# Patient Record
Sex: Male | Born: 1970 | Race: White | Hispanic: No | Marital: Married | Smoking: Never smoker
Health system: Southern US, Community
[De-identification: ages and names within clinical notes are randomized; demographics above are authoritative.]

## PROBLEM LIST (undated history)

## (undated) DIAGNOSIS — K8020 Calculus of gallbladder without cholecystitis without obstruction: Secondary | ICD-10-CM

## (undated) HISTORY — DX: Calculus of gallbladder without cholecystitis without obstruction: K80.20

---

## 1996-03-18 HISTORY — PX: APPENDECTOMY: SHX54

## 1997-09-11 ENCOUNTER — Inpatient Hospital Stay (HOSPITAL_COMMUNITY): Admission: EM | Admit: 1997-09-11 | Discharge: 1997-09-14 | Payer: Self-pay | Admitting: Emergency Medicine

## 1997-09-21 ENCOUNTER — Other Ambulatory Visit: Admission: RE | Admit: 1997-09-21 | Discharge: 1997-09-21 | Payer: Self-pay | Admitting: General Surgery

## 1999-10-10 ENCOUNTER — Emergency Department (HOSPITAL_COMMUNITY): Admission: EM | Admit: 1999-10-10 | Discharge: 1999-10-10 | Payer: Self-pay | Admitting: Emergency Medicine

## 1999-10-11 ENCOUNTER — Encounter: Payer: Self-pay | Admitting: Emergency Medicine

## 2000-12-20 ENCOUNTER — Encounter: Payer: Self-pay | Admitting: Emergency Medicine

## 2000-12-20 ENCOUNTER — Emergency Department (HOSPITAL_COMMUNITY): Admission: EM | Admit: 2000-12-20 | Discharge: 2000-12-21 | Payer: Self-pay | Admitting: Emergency Medicine

## 2009-03-18 HISTORY — PX: TREATMENT FISTULA ANAL: SUR1390

## 2009-07-17 ENCOUNTER — Encounter: Admission: RE | Admit: 2009-07-17 | Discharge: 2009-07-17 | Payer: Self-pay | Admitting: Family Medicine

## 2009-07-18 ENCOUNTER — Encounter: Admission: RE | Admit: 2009-07-18 | Discharge: 2009-07-18 | Payer: Self-pay | Admitting: Family Medicine

## 2009-07-19 ENCOUNTER — Ambulatory Visit: Payer: Self-pay | Admitting: Internal Medicine

## 2009-07-19 ENCOUNTER — Emergency Department (HOSPITAL_COMMUNITY): Admission: EM | Admit: 2009-07-19 | Discharge: 2009-07-19 | Payer: Self-pay | Admitting: Emergency Medicine

## 2009-07-21 ENCOUNTER — Telehealth (INDEPENDENT_AMBULATORY_CARE_PROVIDER_SITE_OTHER): Payer: Self-pay

## 2009-07-21 ENCOUNTER — Encounter: Payer: Self-pay | Admitting: Internal Medicine

## 2009-07-21 ENCOUNTER — Encounter (INDEPENDENT_AMBULATORY_CARE_PROVIDER_SITE_OTHER): Payer: Self-pay

## 2009-07-23 ENCOUNTER — Inpatient Hospital Stay (HOSPITAL_COMMUNITY): Admission: EM | Admit: 2009-07-23 | Discharge: 2009-07-26 | Payer: Self-pay | Admitting: Emergency Medicine

## 2009-07-24 ENCOUNTER — Ambulatory Visit: Payer: Self-pay | Admitting: Gastroenterology

## 2009-07-25 ENCOUNTER — Telehealth (INDEPENDENT_AMBULATORY_CARE_PROVIDER_SITE_OTHER): Payer: Self-pay

## 2009-07-25 ENCOUNTER — Encounter: Payer: Self-pay | Admitting: Urgent Care

## 2009-07-26 ENCOUNTER — Telehealth: Payer: Self-pay | Admitting: Gastroenterology

## 2009-07-26 ENCOUNTER — Ambulatory Visit: Payer: Self-pay | Admitting: Gastroenterology

## 2009-08-23 ENCOUNTER — Encounter: Payer: Self-pay | Admitting: Internal Medicine

## 2009-08-23 DIAGNOSIS — R197 Diarrhea, unspecified: Secondary | ICD-10-CM | POA: Insufficient documentation

## 2009-11-02 ENCOUNTER — Encounter: Payer: Self-pay | Admitting: Gastroenterology

## 2009-11-23 ENCOUNTER — Encounter: Payer: Self-pay | Admitting: Internal Medicine

## 2010-04-17 NOTE — Progress Notes (Signed)
Summary: CAMPYLOBACTER INFECTION       New/Updated Medications: ZITHROMAX 250 MG TABS (AZITHROMYCIN) 2 by mouth 5/11 and 2 by mouth 5/12 Prescriptions: ZITHROMAX 250 MG TABS (AZITHROMYCIN) 2 by mouth 5/11 and 2 by mouth 5/12  #4 x 0   Entered and Authorized by:   West Bali MD   Signed by:   West Bali MD on 07/26/2009   Method used:   Electronically to        [facility] (retail)       [address]       [place], [place]  [address]       Ph: [phone] or [phone]       Fax: [phone]   RxID:   [id number]  Pt d/c today. Need Zmx 5/11-12. Rx sent. Asked to follow a lactose free diet. No antimotility agents. Use Kaopectate as needed. Call Dr. Jena Gauss if diarrhea not improved on MON. Pt also taking Augmentin 875 mg two times a day for perirectal abscess. West Bali MD  Jul 26, 2009 12:06 PM

## 2010-04-17 NOTE — Letter (Signed)
Summary: CLINIC NOTE FROM DR [facility]  CLINIC NOTE FROM DR Ezzard Standing   Imported By: Rexene Alberts 11/23/2009 14:11:25  _____________________________________________________________________  External Attachment:    Type:   Image     Comment:   External Document

## 2010-04-17 NOTE — Letter (Signed)
Summary: CONSULTATION  CONSULTATION   Imported By: Rexene Alberts 11/02/2009 09:53:00  _____________________________________________________________________  External Attachment:    Type:   Image     Comment:   External Document

## 2010-04-17 NOTE — Miscellaneous (Signed)
Summary: Orders Update  Clinical Lists Changes  Problems: Added new problem of DIARRHEA (ICD-787.91) Orders: Added new Test order of T-Culture, C-Diff Toxin A/B [phone]) - Signed Added new Test order of T-Culture, Stool ([id number]) - Signed Added new Test order of T-Fecal WBC ([id number]) - Signed  Appended Document: Orders Update per RMR- contact pt and have him do stool studies because his diarrhea started again. pts wife aware and will pick up stool containers today.

## 2010-04-17 NOTE — Letter (Signed)
Summary: CONSULTATION  CONSULTATION   Imported By: Rexene Alberts 11/02/2009 09:51:24  _____________________________________________________________________  External Attachment:    Type:   Image     Comment:   External Document

## 2010-04-17 NOTE — Progress Notes (Signed)
Summary: stool culture results  Phone Note From Other Clinic   Caller: [facility] Summary of Call: [facility] called critical report on stool culture- positive for campylobacter. please advise. pt is inpatient at [facility] Initial call taken by: Hendricks Limes LPN,  Jul 25, 2009 1:35 PM     Appended Document: stool culture results Will address inpatient.  Thx

## 2010-04-17 NOTE — Miscellaneous (Signed)
Summary: GI Previsit [facility]  Pt inpatient [facility].  Please reschedule OV for tomorrow to 1 week re:FU persistent diarrhea.  Thx  Appended Document: GI Previsit APPT [facility] pt appt to 08/07/09 at 1130 w/KJ.

## 2010-04-17 NOTE — Progress Notes (Signed)
Summary: diarrhea  Phone Note Call from Patient Call back at [phone]   Caller: Spouse Summary of Call: pts wife called- pt was seen by RMR  in the ED for diarrhea and abd pain. he was given hyomax and zofran. He took his stool samples back to the hospital yesterday (no results yet) and has an ov appt next week with [facility]. wife stated the abd pain was better but he still has the diarrhea. wants to know if there is anything he can take to help untill his appt next week. please advise. Initial call taken by: Hendricks Limes LPN,  Jul 22, 5619 9:52 AM     Appended Document: diarrhea imodium 2mg  two times a day, but not more until we know about stool studies  Appended Document: diarrhea pts wife aware

## 2010-04-17 NOTE — Miscellaneous (Signed)
Summary: [facility]  Clinical Lists Changes NAME:  George Clark, George Clark             ACCOUNT NO.:  [id number]      MEDICAL RECORD NO.:  [id number]          PATIENT TYPE:  EMS      LOCATION:  ED                            FACILITY:  [facility]      PHYSICIAN:  R. Roetta Sessions, M.D. DATE OF BIRTH:  Oct 14, 1970      DATE OF CONSULTATION:  07/19/2009   DATE OF DISCHARGE:                                    CONSULTATION      REASON FOR CONSULTATION:  A 5-day history of abdominal pain and   nonbloody diarrhea - incessant.      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  He had some transient nausea and nonbloody emesis.   His abdominal discomfort has waxed and waned over the past 5 days.  It   becomes worse spontaneously and sometimes with eating.  He saw his   primary care physician over in [facility] the first of   the week.  Ultimately workup included an ultrasound and CT which   revealed cholelithiasis but no evidence of cholecystitis.  Contrast CT   of the abdomen and pelvis confirmed cholelithiasis  but no other   significant findings.  I have reviewed both the ultrasound and CT with   Dr. Amil Amen.  There is a question of some thickening of the transverse   and descending colon, possibly representing colitis.  However, with a   diarrheal illness this may be an over-call.  No evidence of enteritis or   other acute intra-abdominal abnormality.  He continues to have   predominately intermittent waxing and waning crampy generalized   abdominal pain along with nonbloody diarrhea.  He has an appointment to   see his gastroenterologist, Dr. Ritta Slot, sometime next week but   elected to come to the emergency department  today when he was seen by   Dr. Lynelle Doctor.  Workup in the emergency department included a Chem-20, CBC,   urinalysis and lipase all of which came back normal.  He has been given   a dose of Zofran 4 mg here in the emergency department and has not had   any nausea or vomiting.  In fact after telephone consultation he was   given a trial of liquids in the way of ginger ale and has held that down   and done very well and has not had any abdominal pain while being here   over a nearly 3 hour period.      George Clark premorbidly, relates one formed bowel movement daily.  He   has not had any rectal bleeding or melena recently.  No sick contacts.   No unusual travel.  No antibiotics exposure recently.  No family history   of GI illness.  He did undergo a colonoscopy about a year ago by Dr. Kinnie Scales for rectal   bleeding without significant findings.      PAST MEDICAL HISTORY:  Significant for no chronic illnesses.  Status   post appendectomy years ago.  Denies chronic illnesses.  Takes no   prescribed medications although was given a prescription for Vicodin   early this week by his primary care physician.      ALLERGIES:  NO KNOWN DRUG ALLERGIES.      FAMILY HISTORY:  Negative for chronic GI or liver illness.      SOCIAL HISTORY:  The patient is a Visual merchandiser.  He has three daughters in   good health.  He does not use tobacco or alcohol products.      REVIEW OF SYSTEMS:  Has lost 7 pounds in the past 5 days with the acute   illness.  Does not have any chronic symptoms such as odynophagia,   dysphagia, early satiety, reflux symptoms.  Denies any history of yellow   jaundice.  No fever, chills.  Otherwise as in history of present   illness.      PHYSICAL EXAMINATION:  A very pleasant, comfortable-appearing gentleman   seen in ED room 11, accompanied by his daughter and father-in-law, BP   96/61, pulse 64, respiratory rate 20, temperature 93 orally.   SKIN: Warm and dry.   HEENT EXAM: No  scleral icterus.  Conjunctivae pink.  Oral cavity: No   lesions.   CHEST: Lungs clear to auscultation.   CARDIAC EXAM: Regular rate and rhythm without murmur, gallop or rub.   ABDOMEN: Flat, positive bowel sounds, soft, entirely nontender without   appreciable mass or organomegaly.   EXTREMITY EXAM: No edema.      LABORATORY DATA:  In the emergency department, white count 4.3,   hemoglobin 14.2 hematocrit 39.7, and MCV 90.2, platelet count 214,000.   Sodium 136, potassium 4.9, chloride 100, carbon dioxide 31, glucose 90,   BUN 3, creatinine 1.02, calcium 9.2, total protein 7.9, albumin 4.0, AST   16, ALT 14.  Urinalysis negative.  Lipase normal at 37.  CBC   differential monocyte count slightly elevated at 21.      IMPRESSION:  George Clark is a very pleasant 40 year old   gentleman with a 5-day history of nonbloody diarrhea, waxing and waning   crampy generalized abdominal pain along with transient nausea and   vomiting.  Symptoms of diarrhea and abdominal pain have now persisted 5   days.  At this time, George Clark does not appear acutely ill or toxic.   Findings of ultrasound and CT scanning along with labs as outlined   above.      It is my impression that George Clark most likely has a viral induced   gastroenteritis (primarily an enteritis or enterocolitis at this time).   He certainly could have food-borne illness as well.  There is nothing to   suggest the onset of inflammatory bowel disease.  Did not see any   surgical process at this time.      He does have cholelithiasis likely a bystander finding in this setting.      RECOMMENDATIONS:   1. The patient has received IV fluids here and is taking clear liquids       and really looks quite stable at this time.  I recommend he be       allowed to go home on clear liquids rapidly advanced to a [address]  diet.   2. Prescription for HyoMax 0.125 mg tablets, #30, one sublingually       a.c. and h.s. p.r.n. abdominal  pain.   3. Zofran 4 mg tablets, dispense #20, one p.o. q.8 hours p.r.n.       nausea.   4. Will arrange for him to go home with a stool collection container       to return a fresh stool specimen for lactoferrin, C diff, and       culture.   5. Will arrange for a follow-up appointment with [place] in the office next       week to assess his progress.  If his clinical condition       deteriorates in the interim, he is to call and let me know.      I would thank Dr. Lynelle Doctor here in the ED for allowing me to see this very   nice gentleman today.               Jonathon Bellows, M.D.            RMR/MEDQ  D:  07/19/2009  T:  07/19/2009  Job:  [id number]      cc:   Dr. Essie Hart [employer]      Griffith Citron, M.D.   Fax: [phone]    CT Abd/Pelvis W CM - STATUS: Final  IMAGE                                     Perform Date: 3 May11 11:46  Ordered By: Elyse Jarvis,         Ordered Date: 3 May11 09:53  Facility: [facility]                              Department: CT  Service Report Text  GDC Accession Number: [id number]      Clinical Data: Abdominal pain.  Anorexia.  Nausea.  Cholelithiasis.    CT ABDOMEN AND PELVIS WITH CONTRAST    Technique:  Multidetector CT imaging of the abdomen and pelvis was   performed following the standard protocol during bolus   administration of intravenous contrast.    Contrast: 100 ml Omnipaque-300 and oral contrast    Comparison: None.    Findings: Cholelithiasis is noted, however there is no evidence of   gallbladder wall thickening or pericholecystic inflammatory change.   No evidence of biliary ductal dilatation.    The pancreas is normal in appearance.  There is no evidence of   pancreatic mass or peripancreatic inflammatory changes.  No   abnormal fluid collections are seen within the abdomen or pelvis.    The liver, spleen, adrenal glands, and kidneys are normal   appearance.  No soft tissue masses or adenopathy  identified.  No   evidence of dilated bowel loops or hernia.  IMPRESSION:    1.  No acute findings in the abdomen or pelvis.   2.  Cholelithiasis.  No CT evidence of pancreatitis or pancreatic   mass.    Read By:  Danae Orleans,  M.D.   Released By:  Danae Orleans,  M.D.  Additional Information  HL7 RESULT STATUS : F  External image : [id number]  External IF Update Timestamp : 2009-07-18:11:56:25.000000    US Abdomen Complete -  STATUS: Final  IMAGE                                     Perform Date: 2 May11 16:29  Ordered By: Elyse Jarvis,         Ordered Date: 2 May11 15:34  Facility: [facility]                              Department: [place]  Service Report Text  GDC Accession Number: [id number]      Clinical Data:  Abdominal pain with nausea and vomiting    COMPLETE ABDOMINAL ULTRASOUND    Comparison:  None.    Findings:    Gallbladder:  The gallbladder is small and may contain stones.   However, the patient ate a small meal around 11:30 a.m. and   therefore is not fasting for this exam.  The gallbladder findings   could therefore artifactual, due to physiological contractions.  If   further assessment as clinically warranted, a repeat limited study   should be done following an overnight fast.    Common bile duct:  2.7 mm    Liver:  No focal lesion identified.  Within normal limits in   parenchymal echogenicity.    IVC:  Normal    Pancreas:  Body normal.  Head and tail suboptimally visualized due   to overlying bowel gas.    Spleen:  Upper normal in size with no lesions.    Right Kidney:  10.4 cm in length.  No hydronephrosis or other   pathology.    Left Kidney:  10.6 cm in length.  No pathology.    Abdominal aorta:  Normal.    IMPRESSION:    1.  The gallbladder is contracted but this may be physiological,   since the patient ate a small meal several hours before the exam.   See report.   2.  Pancreas suboptimally visualized.   3.  No other  acute or significant findings.    Read By:  Bernerd Limbo,  M.D.   Released By:  Bernerd Limbo,  M.D.  Additional Information  HL7 RESULT STATUS : F  External image : [id number]  External IF Update Timestamp : 2009-07-17:16:37:48.000000   L-Lipase, Blood - STATUS: Final                                            Perform Date: 4 May11 12:56  Ordered By: Aggie Hacker,             Ordered Date: 4 May11 12:49                                       Last Updated Date: 4 May11 14:51  Facility: [facility]                               Department: GENL  Accession #: [id number]                  USN:       [id number]  Findings  Result  Name                              Result     Abnl   Normal Range     Units      Perf. Loc.  Lipase                                            37                11-59            U/L  Additional Information  HL7 RESULT STATUS : F  External IF Update Timestamp : 2009-07-19:14:48:00.000000    L-CBC-with Differential - STATUS: Final                                            Perform Date: 4 May11 12:54  Ordered By: Aggie Hacker,             Ordered Date: 4 May11 12:35                                       Last Updated Date: 4 May11 13:52  Facility: [facility]                               Department: GENL  Accession #: [id number]                    USN:       [id number]  Findings  Result Name                              Result     Abnl   Normal Range     Units      Perf. Loc.  WBC                                           4.3               4.0-10.5         K/uL  RBC                                            4.40              4.22-5.81        MIL/uL  Hemoglobin (HGB)                        14.2              13.0-17.0        g/dL  Hematocrit (HCT)  39.7              39.0-52.0        %  MCV                                            90.2              78.0-100.0       fL  MCHC                                           35.8              30.0-36.0        g/dL  RDW                                            11.9              11.5-15.5        %  Platelet Count (PLT)                       214               150-400          K/uL  Neutrophils, %                               51                43-77            %  Lymphocytes, %                           25                12-46            %  Monocytes, %                               21         h      3-12             %  Eosinophils, %                                3                 0-5              %  Basophils, %                                   0                 0-1              %  Neutrophils, Absolute  2.2               1.7-7.7          K/uL  Lymphocytes, Absolute                  1.1               0.7-4.0          K/uL  Monocytes, Absolute                      0.9               0.1-1.0          K/uL  Eosinophils, Absolute                      0.1               0.0-0.7          K/uL  Basophils, Absolute                        0.0               0.0-0.1          K/uL  WBC Morphology                           SEE NOTE.    TOXIC GRANULATION  Additional Information  HL7 RESULT STATUS : F  External IF Update Timestamp : 2009-07-19:13:48:00.000000    L-CMP (Comprehensive Metabolic Pnl-CMET) - STATUS: Final                                            Perform Date: 4 May11 12:54  Ordered By: Aggie Hacker,             Ordered Date: 4 May11 12:35                                       Last Updated Date: 4 May11 13:43  Facility: [facility]                               Department: GENL  Accession #: [id number]                     USN:       [id number]  Findings  Result Name                              Result     Abnl   Normal Range     Units      Perf. Loc.  Sodium (NA)                              136               135-145          mEq/L  Potassium (K)  4.9               3.5-5.1           mEq/L  Chloride                                     100               96-112           mEq/L  CO2                                            31                19-32            mEq/L  Glucose                                      90                70-99            mg/dL  BUN                                           13                6-23             mg/dL  Creatinine                                  1.02               0.4-1.5         mg/dL           GFR, Est African American           >60               >60              mL/min    Oversized comment, see footnote    Bilirubin, Total                               0.5               0.3-1.2          mg/dL  Alkaline Phosphatase                     62                39-117           U/L  SGOT (AST)                                  16                0-37  U/L  SGPT (ALT)                                   14                0-53             U/L  Total  Protein                                 7.9               6.0-8.3          g/dL  Albumin-Blood                               4.0               3.5-5.2          g/dL  Calcium                                         9.2               8.4-10.5         mg/dL  Footnotes  1. The eGFR has been calculated     using the MDRD equation.     This calculation has not been     validated in all clinical     situations.     eGFR's persistently     <60 mL/min signify     possible Chronic Kidney Disease.  Additional Information  HL7 RESULT STATUS : F  External IF Update Timestamp : 2009-07-19:13:39:00.000000

## 2010-06-05 LAB — CBC
MCHC: 36 g/dL (ref 30.0–36.0)
MCV: 89.9 fL (ref 78.0–100.0)
Platelets: 214 10*3/uL (ref 150–400)
RBC: 3.85 MIL/uL — ABNORMAL LOW (ref 4.22–5.81)
RDW: 11.9 % (ref 11.5–15.5)

## 2010-06-05 LAB — DIFFERENTIAL
Basophils Relative: 0 % (ref 0–1)
Basophils Relative: 1 % (ref 0–1)
Eosinophils Absolute: 0.1 10*3/uL (ref 0.0–0.7)
Eosinophils Absolute: 0.1 10*3/uL (ref 0.0–0.7)
Eosinophils Relative: 2 % (ref 0–5)
Lymphocytes Relative: 25 % (ref 12–46)
Monocytes Absolute: 0.9 10*3/uL (ref 0.1–1.0)
Monocytes Absolute: 1 10*3/uL (ref 0.1–1.0)
Monocytes Relative: 13 % — ABNORMAL HIGH (ref 3–12)
Neutro Abs: 2.2 10*3/uL (ref 1.7–7.7)
Neutrophils Relative %: 67 % (ref 43–77)

## 2010-06-05 LAB — URINALYSIS, ROUTINE W REFLEX MICROSCOPIC
Bilirubin Urine: NEGATIVE
Glucose, UA: NEGATIVE mg/dL
Hgb urine dipstick: NEGATIVE
Ketones, ur: NEGATIVE mg/dL
Nitrite: NEGATIVE
Protein, ur: NEGATIVE mg/dL
Specific Gravity, Urine: 1.015 (ref 1.005–1.030)
Urobilinogen, UA: 0.2 mg/dL (ref 0.0–1.0)
pH: 6 (ref 5.0–8.0)

## 2010-06-05 LAB — BASIC METABOLIC PANEL
CO2: 32 mEq/L (ref 19–32)
Chloride: 101 mEq/L (ref 96–112)
Creatinine, Ser: 1.05 mg/dL (ref 0.4–1.5)
GFR calc Af Amer: >60 mL/min (ref >60)

## 2010-06-05 LAB — CULTURE, ROUTINE-ABSCESS

## 2010-06-05 LAB — COMPREHENSIVE METABOLIC PANEL
ALT: 14 U/L (ref 0–53)
AST: 16 U/L (ref 0–37)
Albumin: 4 g/dL (ref 3.5–5.2)
Alkaline Phosphatase: 62 U/L (ref 39–117)
Potassium: 4.9 mEq/L (ref 3.5–5.1)
Sodium: 136 mEq/L (ref 135–145)
Total Protein: 7.9 g/dL (ref 6.0–8.3)

## 2012-12-17 ENCOUNTER — Ambulatory Visit (INDEPENDENT_AMBULATORY_CARE_PROVIDER_SITE_OTHER): Payer: BC Managed Care – PPO | Admitting: Otolaryngology

## 2012-12-17 DIAGNOSIS — H60339 Swimmer's ear, unspecified ear: Secondary | ICD-10-CM

## 2012-12-31 ENCOUNTER — Ambulatory Visit (INDEPENDENT_AMBULATORY_CARE_PROVIDER_SITE_OTHER): Payer: BC Managed Care – PPO | Admitting: Otolaryngology

## 2013-11-19 ENCOUNTER — Other Ambulatory Visit (HOSPITAL_COMMUNITY): Payer: Self-pay | Admitting: Internal Medicine

## 2013-11-19 DIAGNOSIS — R10811 Right upper quadrant abdominal tenderness: Secondary | ICD-10-CM

## 2013-11-19 DIAGNOSIS — R109 Unspecified abdominal pain: Secondary | ICD-10-CM

## 2013-11-23 ENCOUNTER — Other Ambulatory Visit (HOSPITAL_COMMUNITY): Payer: Self-pay | Admitting: Internal Medicine

## 2013-11-23 DIAGNOSIS — R109 Unspecified abdominal pain: Secondary | ICD-10-CM

## 2013-11-26 ENCOUNTER — Other Ambulatory Visit (HOSPITAL_COMMUNITY): Payer: BC Managed Care – PPO

## 2013-11-29 ENCOUNTER — Ambulatory Visit (HOSPITAL_COMMUNITY)
Admission: RE | Admit: 2013-11-29 | Discharge: 2013-11-29 | Disposition: A | Discharge status: Home / Self Care | Payer: BC Managed Care – PPO | Source: Ambulatory Visit | Attending: Internal Medicine | Admitting: Internal Medicine

## 2013-11-29 DIAGNOSIS — R10811 Right upper quadrant abdominal tenderness: Secondary | ICD-10-CM | POA: Diagnosis present

## 2013-11-29 DIAGNOSIS — R932 Abnormal findings on diagnostic imaging of liver and biliary tract: Secondary | ICD-10-CM | POA: Insufficient documentation

## 2013-11-29 DIAGNOSIS — R109 Unspecified abdominal pain: Secondary | ICD-10-CM

## 2013-11-29 MED ORDER — IOHEXOL 350 MG/ML SOLN
100.0000 mL | Freq: Once | INTRAVENOUS | Status: AC | PRN
  Administered 2013-11-29: 100 mL via INTRAVENOUS

## 2014-11-01 ENCOUNTER — Ambulatory Visit (INDEPENDENT_AMBULATORY_CARE_PROVIDER_SITE_OTHER): Payer: BLUE CROSS/BLUE SHIELD | Admitting: Nurse Practitioner

## 2014-11-01 ENCOUNTER — Other Ambulatory Visit: Payer: Self-pay

## 2014-11-01 ENCOUNTER — Encounter: Payer: Self-pay | Admitting: Nurse Practitioner

## 2014-11-01 VITALS — BP 98/62 | HR 67 | Temp 97.5°F | Ht 69.0 in | Wt 132.8 lb

## 2014-11-01 DIAGNOSIS — R1033 Periumbilical pain: Secondary | ICD-10-CM

## 2014-11-01 DIAGNOSIS — R109 Unspecified abdominal pain: Secondary | ICD-10-CM | POA: Insufficient documentation

## 2014-11-01 MED ORDER — OMEPRAZOLE 20 MG PO CPDR
20.0000 mg | DELAYED_RELEASE_CAPSULE | Freq: Every day | ORAL | Status: DC
Start: 2014-11-01 — End: 2021-12-30

## 2014-11-01 NOTE — Progress Notes (Signed)
Primary Care Physician:  Cassell Smiles., MD Primary Gastroenterologist:  Dr. Jena Gauss  Chief Complaint  Patient presents with  . Abdominal Pain    HPI:   44 year old male presents for abdominal pain. Has historically been seen by our office with the last visit in 2011. Today he states he has been having periumbilical abdominal pain, at times severe. Pain is also in his lower to mid back and is constant. Abdominal pain was intermittent but has progressed to constant. Has had these symptoms for about 7 months. Patient has also been having bouts of diarrhea for the last couple weeks. Pain not typically associated with eating. Has also had decreased appetite. Wife states she noticed more complaints of pain after eating, especially greasy or fatty foods. Pain currently 5-6/10. Denies N/V, hematochezia, melena. Has noted some excess mucus in some of his stools. Denies fever/chills of unknown origin, unintentional weight loss. Has noted some occasional chest pain, denies dyspnea, dizziness, lightheadedness, syncope, near syncope. Has had occasional esophageal burning and bloating/feeling of fullness. Denies bitter taste, excessive belching. Denies any other upper or lower GI symptoms. Has been taking Advil for pain for there past month about 200 mg 1-2 times a day.   RUQ [place] last year showed sludge vs non-shadowing stones and borderline CBD diameter. Follow-up CT abdomen/pelvis showed cholelithiasis.  Past Medical History  Diagnosis Date  . Gallstones     Dx last year    Past Surgical History  Procedure Laterality Date  . Appendectomy  1998  . Treatment fistula anal  2011    Current Outpatient Prescriptions  Medication Sig Dispense Refill  . ibuprofen (ADVIL,MOTRIN) 100 MG tablet Take 200 mg by mouth every 6 (six) hours as needed for fever (as needed).    Marland Kitchen omeprazole (PRILOSEC) 20 MG capsule Take 1 capsule (20 mg total) by mouth daily. 90 capsule 3   No current facility-administered  medications for this visit.    Allergies as of 11/01/2014  . (No Known Allergies)    Family History  Problem Relation Age of Onset  . Colon cancer Neg Hx   . Pancreatic cancer Father 85    Social History   Social History  . Marital Status: Married    Spouse Name: N/A  . Number of Children: N/A  . Years of Education: N/A   Occupational History  . Not on file.   Social History Main Topics  . Smoking status: Never Smoker   . Smokeless tobacco: Former Neurosurgeon    Types: Chew    Quit date: 10/31/2008  . Alcohol Use: No  . Drug Use: No  . Sexual Activity: Not on file   Other Topics Concern  . Not on file   Social History Narrative  . No narrative on file    Review of Systems: General: Negative for anorexia, weight loss, fever, chills, fatigue, weakness. Eyes: Negative for vision changes.  ENT: Negative for hoarseness, difficulty swallowing. CV: Negative for chest pain, angina, palpitations, peripheral edema.  Respiratory: Negative for dyspnea at rest, cough, sputum, wheezing.  GI: See history of present illness. Derm: Negative for rash or itching.  Endo: Negative for unusual weight change.  Heme: Negative for bruising or bleeding. Allergy: Negative for rash or hives.    Physical Exam: BP 98/62 mmHg  Pulse 67  Temp(Src) 97.5 F (36.4 C)  Ht 5\' 9"  (1.753 m)  Wt 132 lb 12.8 oz (60.238 kg)  BMI 19.60 kg/m2 General:   Alert and oriented. Pleasant and cooperative.  Well-nourished and well-developed.  Head:  Normocephalic and atraumatic. Eyes:  Without icterus, sclera clear and conjunctiva pink.  Ears:  Normal auditory acuity. Cardiovascular:  S1, S2 present without murmurs appreciated. Normal pulses noted. Extremities without clubbing or edema. Respiratory:  Clear to auscultation bilaterally. No wheezes, rales, or rhonchi. No distress.  Gastrointestinal:  +BS, soft, and non-distended. Mild epigastric and RUQ pain to palpation. No HSM noted. No guarding or rebound.  No masses appreciated.  Rectal:  Deferred  Musculoskalatal:  Symmetrical without gross deformities. Normal posture. Skin:  Intact without significant lesions or rashes. Neurologic:  Alert and oriented x4;  grossly normal neurologically. Psych:  Alert and cooperative. Normal mood and affect. Heme/Lymph/Immune: No excessive bruising noted.    11/02/2014 3:46 PM

## 2014-11-01 NOTE — Patient Instructions (Signed)
1. Have your labs drawn when you're able. 2. We will help you schedule a HIDA scan. 3. Start taking Prilosec 20 mg once a day, 30 minutes before your first meal the day. 4. Return for follow-up in 4 weeks.

## 2014-11-02 LAB — CBC WITH DIFFERENTIAL/PLATELET
BASOS PCT: 1 % (ref 0–1)
Basophils Absolute: 0.1 10*3/uL (ref 0.0–0.1)
EOS ABS: 0.1 10*3/uL (ref 0.0–0.7)
EOS PCT: 1 % (ref 0–5)
HCT: 37.2 % — ABNORMAL LOW (ref 39.0–52.0)
Hemoglobin: 13 g/dL (ref 13.0–17.0)
Lymphocytes Relative: 27 % (ref 12–46)
Lymphs Abs: 1.4 10*3/uL (ref 0.7–4.0)
MCH: 30.9 pg (ref 26.0–34.0)
MCHC: 34.9 g/dL (ref 30.0–36.0)
MCV: 88.4 fL (ref 78.0–100.0)
MONO ABS: 0.5 10*3/uL (ref 0.1–1.0)
MONOS PCT: 9 % (ref 3–12)
MPV: 10.2 fL (ref 8.6–12.4)
NEUTROS PCT: 62 % (ref 43–77)
Neutro Abs: 3.2 10*3/uL (ref 1.7–7.7)
PLATELETS: 280 10*3/uL (ref 150–400)
RBC: 4.21 MIL/uL — ABNORMAL LOW (ref 4.22–5.81)
RDW: 12.4 % (ref 11.5–15.5)
WBC: 5.1 10*3/uL (ref 4.0–10.5)

## 2014-11-02 LAB — LIPASE: Lipase: 26 U/L (ref 7–60)

## 2014-11-02 LAB — COMPREHENSIVE METABOLIC PANEL
ALT: 9 U/L (ref 9–46)
AST: 12 U/L (ref 10–40)
Albumin: 4.2 g/dL (ref 3.6–5.1)
Alkaline Phosphatase: 64 U/L (ref 40–115)
BUN: 18 mg/dL (ref 7–25)
CHLORIDE: 102 mmol/L (ref 98–110)
CO2: 29 mmol/L (ref 20–31)
CREATININE: 1.08 mg/dL (ref 0.60–1.35)
Calcium: 8.9 mg/dL (ref 8.6–10.3)
Glucose, Bld: 88 mg/dL (ref 65–99)
POTASSIUM: 4.1 mmol/L (ref 3.5–5.3)
SODIUM: 138 mmol/L (ref 135–146)
Total Bilirubin: 0.5 mg/dL (ref 0.2–1.2)
Total Protein: 7.1 g/dL (ref 6.1–8.1)

## 2014-11-02 NOTE — Assessment & Plan Note (Signed)
Patient with abdominal pain primarily periumbilical as well as right upper quadrant. Symptoms appear to be worse after eating especially greasy foods. Also noted some esophageal burning as well as bloating/feeling of fullness. No red flag/warning signs or symptoms. Possible etiologies include gastritis, GERD, esophagitis. However the patient is also demonstrated gallstones last year on her abdominal ultrasound. Also possible pancreatitis. Today we will order a CBC, CMP, and lipase. We'll order a HIDA scan with ensure to evaluate gallbladder function. We'll also start him on Prilosec 20 mg daily to assess for possible acid related symptoms. We'll have him return in 4 weeks for follow-up and further evaluation. At that point depending on results can consider referral to surgery versus endoscopy or possibly CT of the abdomen.

## 2014-11-02 NOTE — Progress Notes (Signed)
CC'ED TO PCP 

## 2014-11-04 ENCOUNTER — Encounter (HOSPITAL_COMMUNITY): Admission: RE | Admit: 2014-11-04 | Payer: BLUE CROSS/BLUE SHIELD | Source: Ambulatory Visit

## 2014-11-11 ENCOUNTER — Encounter (HOSPITAL_COMMUNITY): Payer: Self-pay

## 2014-11-11 ENCOUNTER — Encounter (HOSPITAL_COMMUNITY)
Admission: RE | Admit: 2014-11-11 | Discharge: 2014-11-11 | Disposition: A | Discharge status: Home / Self Care | Payer: BLUE CROSS/BLUE SHIELD | Source: Ambulatory Visit | Attending: Nurse Practitioner | Admitting: Nurse Practitioner

## 2014-11-11 DIAGNOSIS — R1033 Periumbilical pain: Secondary | ICD-10-CM | POA: Insufficient documentation

## 2014-11-11 MED ORDER — TECHNETIUM TC 99M MEBROFENIN IV KIT
5.0000 | PACK | Freq: Once | INTRAVENOUS | Status: DC | PRN
  Administered 2014-11-11: 5 via INTRAVENOUS
  Filled 2014-11-11: qty 6

## 2014-11-14 ENCOUNTER — Telehealth: Payer: Self-pay | Admitting: Internal Medicine

## 2014-11-14 NOTE — Telephone Encounter (Signed)
Routing to [facility] for results.

## 2014-11-14 NOTE — Telephone Encounter (Signed)
PATIENT CALLED INQUIRING ABOUT RESULTS FROM RADIOLOGY PROCEDURE.

## 2014-11-17 NOTE — Telephone Encounter (Signed)
Please see result note 

## 2014-11-17 NOTE — Telephone Encounter (Signed)
I spoke with the pt.  

## 2014-12-05 ENCOUNTER — Telehealth: Payer: Self-pay | Admitting: Nurse Practitioner

## 2014-12-05 ENCOUNTER — Encounter: Payer: Self-pay | Admitting: Nurse Practitioner

## 2014-12-05 ENCOUNTER — Ambulatory Visit: Payer: BLUE CROSS/BLUE SHIELD | Admitting: Nurse Practitioner

## 2014-12-05 NOTE — Telephone Encounter (Signed)
PATIENT WAS A NO SHOW AND LETTER SENT  °

## 2014-12-07 NOTE — Telephone Encounter (Signed)
Noted  

## 2015-12-28 ENCOUNTER — Emergency Department (HOSPITAL_COMMUNITY): Payer: BLUE CROSS/BLUE SHIELD

## 2015-12-28 ENCOUNTER — Encounter (HOSPITAL_COMMUNITY): Payer: Self-pay | Admitting: Emergency Medicine

## 2015-12-28 ENCOUNTER — Emergency Department (HOSPITAL_COMMUNITY)
Admission: EM | Admit: 2015-12-28 | Discharge: 2015-12-28 | Disposition: A | Discharge status: Home / Self Care | Payer: BLUE CROSS/BLUE SHIELD | Attending: Emergency Medicine | Admitting: Emergency Medicine

## 2015-12-28 DIAGNOSIS — R079 Chest pain, unspecified: Secondary | ICD-10-CM | POA: Diagnosis not present

## 2015-12-28 DIAGNOSIS — R0789 Other chest pain: Secondary | ICD-10-CM | POA: Insufficient documentation

## 2015-12-28 DIAGNOSIS — R072 Precordial pain: Secondary | ICD-10-CM | POA: Diagnosis not present

## 2015-12-28 DIAGNOSIS — R202 Paresthesia of skin: Secondary | ICD-10-CM

## 2015-12-28 DIAGNOSIS — R200 Anesthesia of skin: Secondary | ICD-10-CM | POA: Diagnosis not present

## 2015-12-28 LAB — CBC
HEMATOCRIT: 42.3 % (ref 39.0–52.0)
HEMOGLOBIN: 14.7 g/dL (ref 13.0–17.0)
MCH: 31.1 pg (ref 26.0–34.0)
MCHC: 34.8 g/dL (ref 30.0–36.0)
MCV: 89.4 fL (ref 78.0–100.0)
Platelets: 273 10*3/uL (ref 150–400)
RBC: 4.73 MIL/uL (ref 4.22–5.81)
RDW: 11.7 % (ref 11.5–15.5)
WBC: 5.1 10*3/uL (ref 4.0–10.5)

## 2015-12-28 LAB — I-STAT TROPONIN, ED
TROPONIN I, POC: 0.01 ng/mL (ref 0.00–0.08)
TROPONIN I, POC: 0 ng/mL (ref 0.00–0.08)

## 2015-12-28 LAB — BASIC METABOLIC PANEL
ANION GAP: 9 (ref 5–15)
BUN: 18 mg/dL (ref 6–20)
CHLORIDE: 102 mmol/L (ref 101–111)
CO2: 26 mmol/L (ref 22–32)
Calcium: 9.1 mg/dL (ref 8.9–10.3)
Creatinine, Ser: 1.15 mg/dL (ref 0.61–1.24)
GFR calc non Af Amer: >60 mL/min (ref >60)
GLUCOSE: 112 mg/dL — AB (ref 65–99)
POTASSIUM: 3.3 mmol/L — AB (ref 3.5–5.1)
Sodium: 137 mmol/L (ref 135–145)

## 2015-12-28 MED ORDER — NITROGLYCERIN 0.4 MG SL SUBL
0.4000 mg | SUBLINGUAL_TABLET | SUBLINGUAL | Status: DC | PRN
  Administered 2015-12-28: 0.4 mg via SUBLINGUAL
  Filled 2015-12-28: qty 1

## 2015-12-28 MED ORDER — GI COCKTAIL ~~LOC~~
30.0000 mL | Freq: Once | ORAL | Status: AC
  Administered 2015-12-28: 30 mL via ORAL
  Filled 2015-12-28: qty 30

## 2015-12-28 MED ORDER — GI COCKTAIL ~~LOC~~: 30.0000 mL | Freq: Once | ORAL | Status: DC

## 2015-12-28 MED ORDER — ASPIRIN EC 325 MG PO TBEC
325.0000 mg | DELAYED_RELEASE_TABLET | Freq: Once | ORAL | Status: AC
  Administered 2015-12-28: 325 mg via ORAL
  Filled 2015-12-28: qty 1

## 2015-12-28 NOTE — ED Notes (Signed)
Taken to CT.

## 2015-12-28 NOTE — ED Triage Notes (Signed)
Pt arrives via POV from home with substernal CP that began 2 days ago. Pt reports pain has gradually worsened. Pt reports dry nonproductive cough, denies fever. States pain is heavy. Denies heart hx. Pt also reports diaphoresis. Left arm tingling.

## 2015-12-28 NOTE — ED Notes (Signed)
Patient at MRI 

## 2015-12-28 NOTE — ED Notes (Signed)
Pt is in stable condition upon d/c and ambulates from ED. 

## 2015-12-28 NOTE — ED Provider Notes (Signed)
[facility]-EMERGENCY DEPT Provider Note   CSN: [id number] Arrival date & time: 12/28/15  1478     History   Chief Complaint Chief Complaint  Patient presents with  . Chest Pain    HPI George Clark is a 45 y.o. male.  HPI   George Clark is a 45 y.o. male with PMH significant for cholelithiasis who presents with gradual onset, colicky, substernal, non-radiating, heavy chest pain over the last 2 days.  He states it has been constant, but will vary in intensity that will last 30-45 minutes, and then ease up, but never completely go away.  Associated symptoms include dry, non-productive cough, one episode of left arm tingling, and states yesterday he seemed to be sweating more than normal throughout the entire day.  He took an Advil and that seems to help.  Denies aggravating factors.  Not exertional.  Denies fever, SOB, abdominal pain, N/V.  Denies cardiac history.  Positive family history, grandfather MI at age 22.  No hx of PE/DVT, recent immobilization, recent trauma/surgery.    Past Medical History:  Diagnosis Date  . Gallstones    Dx last year    Patient Active Problem List   Diagnosis Date Noted  . Abdominal pain 11/01/2014  . DIARRHEA 08/23/2009    Past Surgical History:  Procedure Laterality Date  . APPENDECTOMY  1998  . TREATMENT FISTULA ANAL  2011       Home Medications    Prior to Admission medications   Medication Sig Start Date End Date Taking? Authorizing Provider  ibuprofen (ADVIL,MOTRIN) 100 MG tablet Take 200 mg by mouth every 6 (six) hours as needed for fever (as needed).   Yes Historical Provider, MD  omeprazole (PRILOSEC) 20 MG capsule Take 1 capsule (20 mg total) by mouth daily. Patient not taking: Reported on 12/28/2015 11/01/14   Anice Paganini, NP    Family History Family History  Problem Relation Age of Onset  . Pancreatic cancer Father 43  . Colon cancer Neg Hx     Social History Social History  Substance Use Topics  . Smoking status:  Never Smoker  . Smokeless tobacco: Former Neurosurgeon    Types: Chew    Quit date: 10/31/2008  . Alcohol use No     Allergies   Review of patient's allergies indicates no known allergies.   Review of Systems Review of Systems All other systems negative unless otherwise stated in HPI   Physical Exam Updated Vital Signs BP 109/66 (BP Location: Right Arm)   Pulse 62   Temp 97.9 F (36.6 C) (Oral)   Resp 17   SpO2 98%   Physical Exam  Constitutional: He is oriented to person, place, and time. He appears well-developed and well-nourished.  Non-toxic appearance. He does not have a sickly appearance. He does not appear ill.  HENT:  Head: Normocephalic and atraumatic.  Mouth/Throat: Oropharynx is clear and moist.  Eyes: Conjunctivae are normal. Pupils are equal, round, and reactive to light.  Neck: Normal range of motion. Neck supple.  Cardiovascular: Normal rate, regular rhythm, normal heart sounds and intact distal pulses.   Pulmonary/Chest: Effort normal and breath sounds normal. No accessory muscle usage or stridor. No respiratory distress. He has no wheezes. He has no rhonchi. He has no rales.  Abdominal: Soft. Bowel sounds are normal. He exhibits no distension. There is no tenderness. There is no rebound and no guarding.  Musculoskeletal: Normal range of motion.  Lymphadenopathy:    He has no cervical adenopathy.  Neurological: He is alert and oriented to person, place, and time.  Speech clear without dysarthria. Normal strength and sensation throughout bilateral upper extremities.   Skin: Skin is warm and dry.  Psychiatric: He has a normal mood and affect. His behavior is normal.     ED Treatments / Results  Labs (all labs ordered are listed, but only abnormal results are displayed) Labs Reviewed  BASIC METABOLIC PANEL - Abnormal; Notable for the following:       Result Value   Potassium 3.3 (*)    Glucose, Bld 112 (*)    All other components within normal limits  CBC    I-STAT TROPOININ, ED  Rosezena Sensor, ED    EKG  EKG Interpretation  Date/Time:  Thursday December 28 2015 09:33:56 EDT Ventricular Rate:  76 PR Interval:    QRS Duration: 91 QT Interval:  389 QTC Calculation: 438 R Axis:   78 Text Interpretation:  Sinus rhythm No old tracing to compare Confirmed by [facility] MD, PEDRO [phone]) on 12/28/2015 11:04:21 AM       Radiology Dg Chest 2 View  Result Date: 12/28/2015 CLINICAL DATA:  Chest pain and fever EXAM: CHEST  2 VIEW COMPARISON:  None. FINDINGS: Lungs are clear. Heart size and pulmonary vascularity are normal. No adenopathy. No bone lesions. No pneumothorax. IMPRESSION: No abnormality noted. Electronically Signed   By: Bretta Bang III M.D.   On: 12/28/2015 10:02   Ct Head Wo Contrast  Result Date: 12/28/2015 CLINICAL DATA:  Left upper extremity numbness for 1 day EXAM: CT HEAD WITHOUT CONTRAST TECHNIQUE: Contiguous axial images were obtained from the base of the skull through the vertex without intravenous contrast. COMPARISON:  None. FINDINGS: Brain: The ventricles are normal in size and configuration. There is no intracranial mass, hemorrhage, extra-axial fluid collection, or midline shift. Gray-white compartments appear normal. No acute infarct is evident. Vascular: No hyperdense vessels are evident. There is minimal calcification in the left carotid siphon. Skull: The bony calvarium appears intact. Sinuses/Orbits: There is mild mucosal thickening in the left maxillary antrum. Other visualized paranasal sinuses are clear. There is leftward deviation of the nasal septum. Orbits appear symmetric bilaterally. Other: Mastoid air cells are clear. IMPRESSION: No intracranial mass, hemorrhage, or extra-axial fluid collection. Gray-white compartments appear normal. Minimal calcification in the left carotid siphon. Mild mucosal thickening left maxillary antrum. Deviated nasal septum noted. Electronically Signed   By: Bretta Bang III  M.D.   On: 12/28/2015 12:13   Mr Brain Wo Contrast  Result Date: 12/28/2015 CLINICAL DATA:  Left arm tingling EXAM: MRI HEAD WITHOUT CONTRAST TECHNIQUE: Multiplanar, multiecho pulse sequences of the brain and surrounding structures were obtained without intravenous contrast. COMPARISON:  Head CT from earlier today FINDINGS: Brain: No acute infarction, hemorrhage, hydrocephalus, extra-axial collection or mass lesion. Two small white matter FLAIR hyperintensities, allowable for age. No evidence of demyelinating disease. Vascular: Normal flow voids. Skull and upper cervical spine: Normal marrow signal. Sinuses/Orbits: Mucosal thickening in the maxillary sinuses predominantly. No acute sinusitis. Other: None. IMPRESSION: Negative exam.  No explanation for arm tingling. Electronically Signed   By: Marnee Spring M.D.   On: 12/28/2015 15:05    Procedures Procedures (including critical care time)  Medications Ordered in ED Medications  nitroGLYCERIN (NITROSTAT) SL tablet 0.4 mg (0.4 mg Sublingual Given 12/28/15 1111)  aspirin EC tablet 325 mg (325 mg Oral Given 12/28/15 1111)  gi cocktail (Maalox,Lidocaine,Donnatal) (30 mLs Oral Given 12/28/15 1119)     Initial Impression /  Assessment and Plan / ED Course  I have reviewed the triage vital signs and the nursing notes.  Pertinent labs & imaging results that were available during my care of the patient were reviewed by me and considered in my medical decision making (see chart for details).  Clinical Course   Patient presents with chest pain x 2 days.  Not exertional.  No cardiac history.  Consider ACS, but low risk, HEART score 2.  Delta troponin normal.  CP unchanged after ASA and NTG.  Will give GI cocktail. Reports improvement. Low risk Well's criteria PE, PERC negative.  Has some left arm tinging/numbness.  Consider CVA, will obtain CT and MRI.  CT head unremarkable, MR unremarkable.  Doubt dissection.  Follow up neurology.  Return precautions  discussed.  Stable for discharge.  Case has been discussed with and seen by Dr. Eudelia Bunchardama who agrees with the above plan for discharge.   Final Clinical Impressions(s) / ED Diagnoses   Final diagnoses:  Atypical chest pain  Paresthesias    New Prescriptions New Prescriptions   No medications on file     Cheri FowlerKayla Macon Sandiford, Cordelia Poche-C 12/28/15 1541    Nira ConnPedro Eduardo Cardama, MD 12/29/15 [phone]

## 2017-10-05 DIAGNOSIS — Z6820 Body mass index (BMI) 20.0-20.9, adult: Secondary | ICD-10-CM | POA: Diagnosis not present

## 2017-10-05 DIAGNOSIS — R1013 Epigastric pain: Secondary | ICD-10-CM | POA: Diagnosis not present

## 2017-10-05 DIAGNOSIS — M546 Pain in thoracic spine: Secondary | ICD-10-CM | POA: Diagnosis not present

## 2017-10-05 DIAGNOSIS — R079 Chest pain, unspecified: Secondary | ICD-10-CM | POA: Diagnosis not present

## 2017-12-19 DIAGNOSIS — K3580 Unspecified acute appendicitis: Secondary | ICD-10-CM | POA: Diagnosis not present

## 2018-03-23 ENCOUNTER — Other Ambulatory Visit (HOSPITAL_COMMUNITY): Payer: Self-pay | Admitting: Sports Medicine

## 2018-03-23 DIAGNOSIS — M545 Low back pain, unspecified: Secondary | ICD-10-CM

## 2018-03-23 DIAGNOSIS — M5126 Other intervertebral disc displacement, lumbar region: Secondary | ICD-10-CM | POA: Diagnosis not present

## 2018-03-27 ENCOUNTER — Ambulatory Visit (HOSPITAL_COMMUNITY)
Admission: RE | Admit: 2018-03-27 | Discharge: 2018-03-27 | Disposition: A | Discharge status: Home / Self Care | Payer: BLUE CROSS/BLUE SHIELD | Source: Ambulatory Visit | Attending: Sports Medicine | Admitting: Sports Medicine

## 2018-03-27 DIAGNOSIS — M545 Low back pain, unspecified: Secondary | ICD-10-CM

## 2018-03-30 DIAGNOSIS — M5116 Intervertebral disc disorders with radiculopathy, lumbar region: Secondary | ICD-10-CM | POA: Diagnosis not present

## 2018-03-30 DIAGNOSIS — M5416 Radiculopathy, lumbar region: Secondary | ICD-10-CM | POA: Diagnosis not present

## 2018-03-31 DIAGNOSIS — M5416 Radiculopathy, lumbar region: Secondary | ICD-10-CM | POA: Diagnosis not present

## 2018-03-31 DIAGNOSIS — M5126 Other intervertebral disc displacement, lumbar region: Secondary | ICD-10-CM | POA: Diagnosis not present

## 2018-04-07 DIAGNOSIS — M5126 Other intervertebral disc displacement, lumbar region: Secondary | ICD-10-CM | POA: Diagnosis not present

## 2018-04-07 DIAGNOSIS — M5416 Radiculopathy, lumbar region: Secondary | ICD-10-CM | POA: Diagnosis not present

## 2018-09-09 ENCOUNTER — Other Ambulatory Visit: Payer: Self-pay | Admitting: Internal Medicine

## 2018-09-09 ENCOUNTER — Other Ambulatory Visit: Payer: Self-pay

## 2018-09-09 DIAGNOSIS — Z20822 Contact with and (suspected) exposure to covid-19: Secondary | ICD-10-CM

## 2018-09-09 NOTE — Progress Notes (Unsigned)
lab

## 2018-09-13 LAB — NOVEL CORONAVIRUS, NAA: SARS-CoV-2, NAA: NOT DETECTED

## 2019-05-20 DIAGNOSIS — Z23 Encounter for immunization: Secondary | ICD-10-CM | POA: Diagnosis not present

## 2019-06-25 DIAGNOSIS — Z23 Encounter for immunization: Secondary | ICD-10-CM | POA: Diagnosis not present

## 2019-07-25 IMAGING — MR MR LUMBAR SPINE W/O CM
4 of 5 series · 15 of 48 positions shown · non-contrast
Comparison: CT abdomen and pelvis 11/29/2013

CLINICAL DATA: Low back pain radiating down the left leg to the
knee for 6 days.

EXAM:
MRI LUMBAR SPINE WITHOUT CONTRAST
TECHNIQUE: Multiplanar, multisequence MR imaging of the lumbar spine was
performed. No intravenous contrast was administered.

[Series 3: T2 · sagittal · 4.0mm · 0.72mm/px · 5 of 15 slices shown (1 of 2)]
[im 1/15]
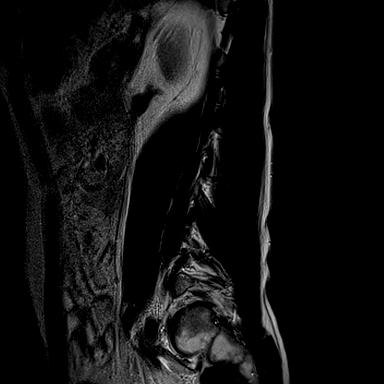
[im 4/15]
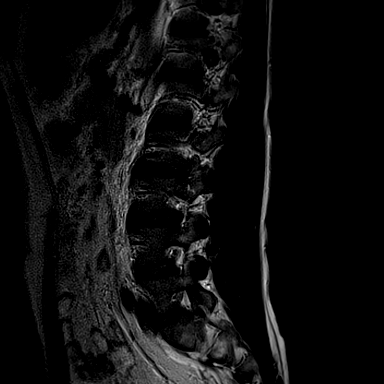
[im 8/15]
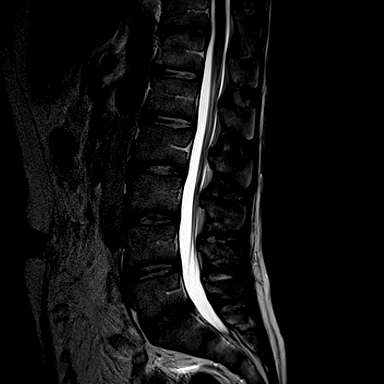
[im 11/15]
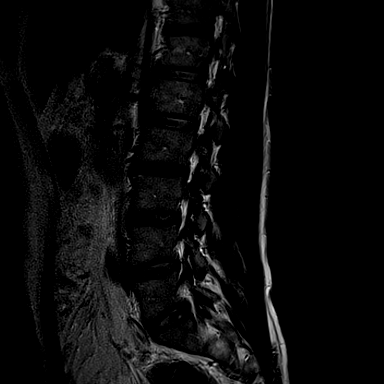
[im 15/15]
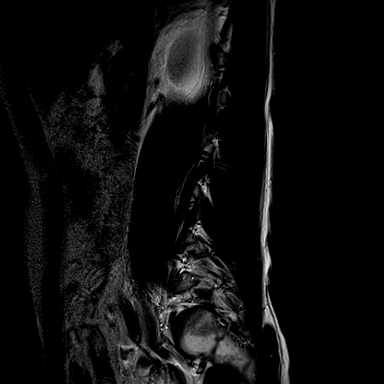

[Series 4: T1 · sagittal · 4.0mm · 0.36mm/px · 3 of 15 slices shown (1 of 2)]
[im 1/15]
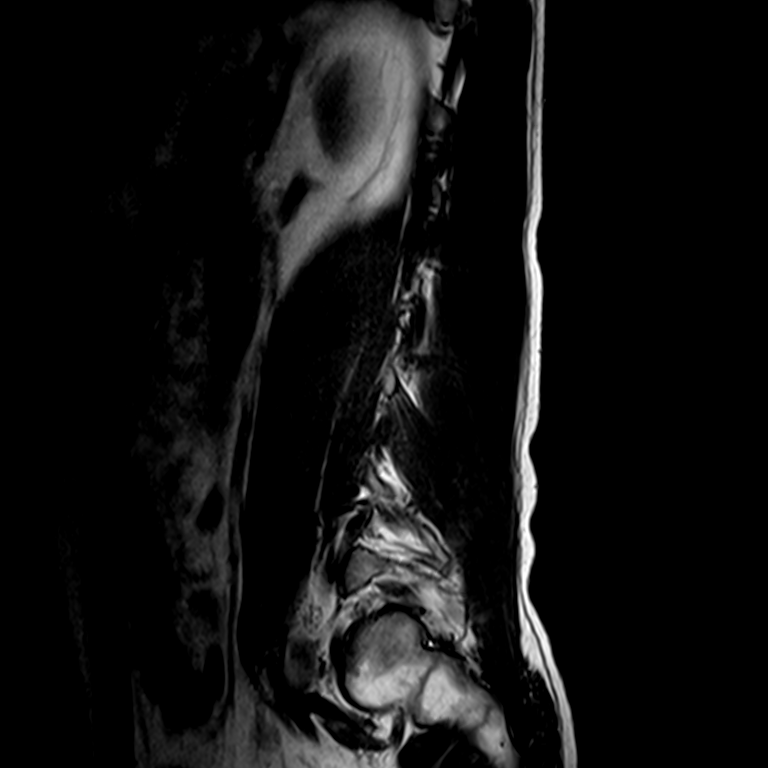
[im 8/15]
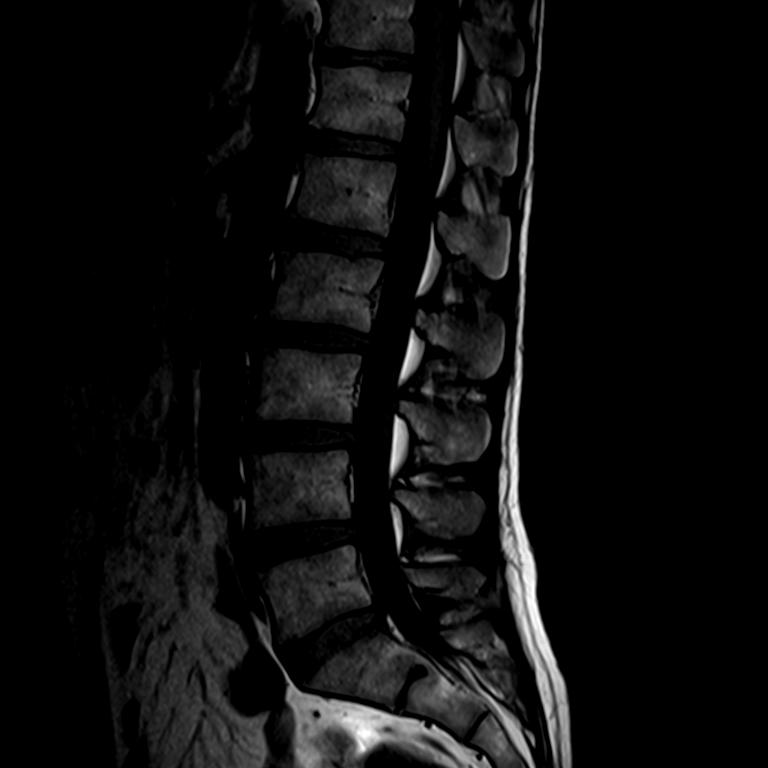
[im 15/15]
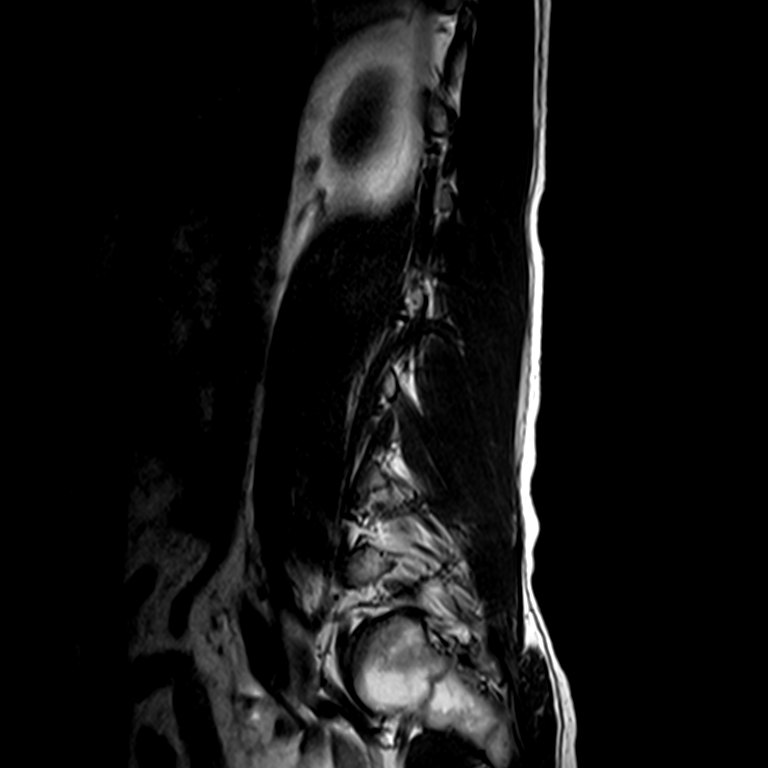

[Series 6: T2 · axial · 4.0mm · 0.25mm/px · z∈[-131,+28]mm · 4 of 42 slices shown (2 of 2)]
[im 3/42]
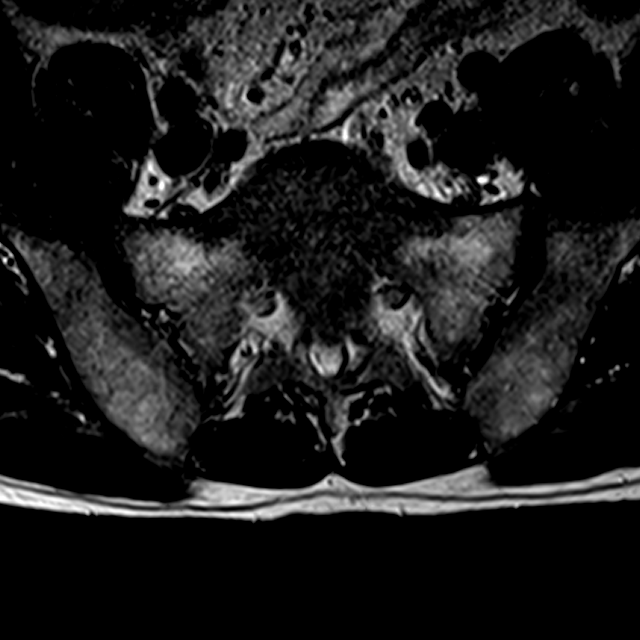
[im 6/42]
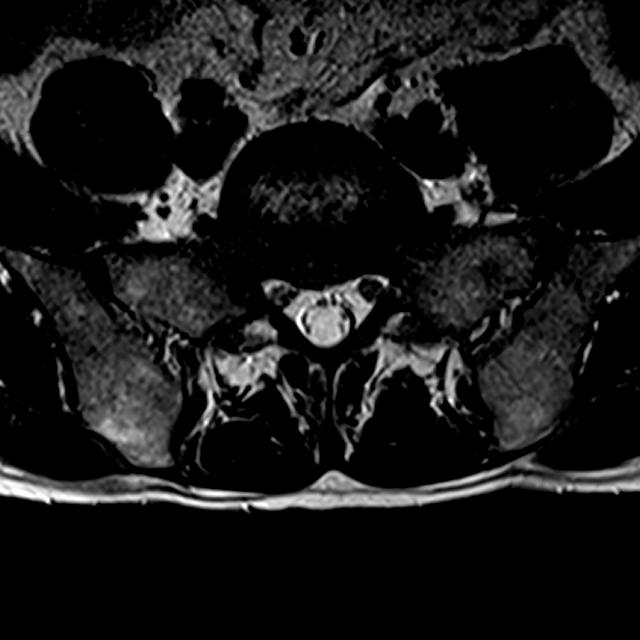
[im 22/42]
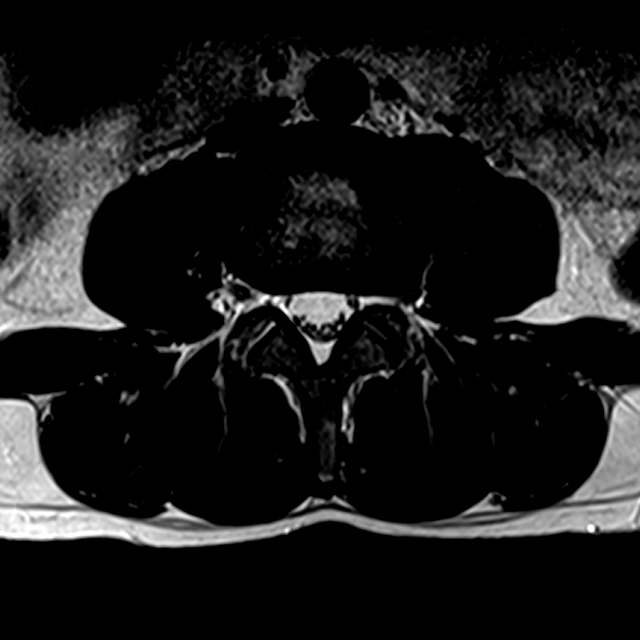
[im 36/42]
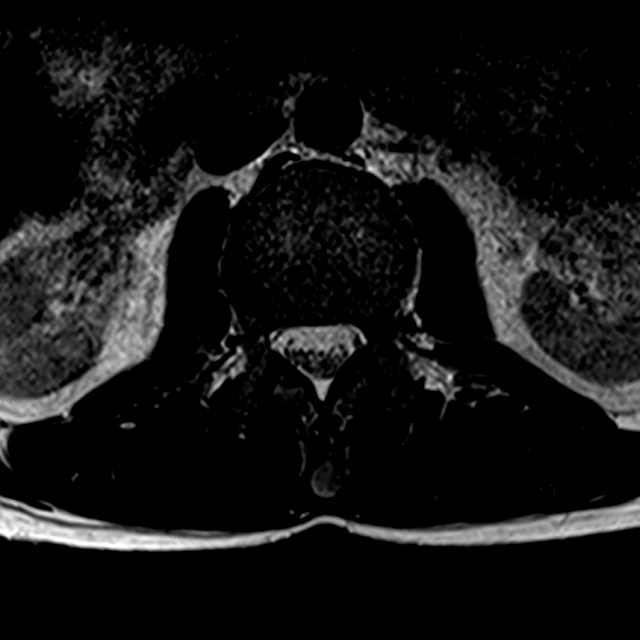

[Series 7: T1 · axial · 4.0mm · 0.25mm/px · z∈[-118,+28]mm · 3 of 42 slices shown (2 of 2)]
[im 6/42]
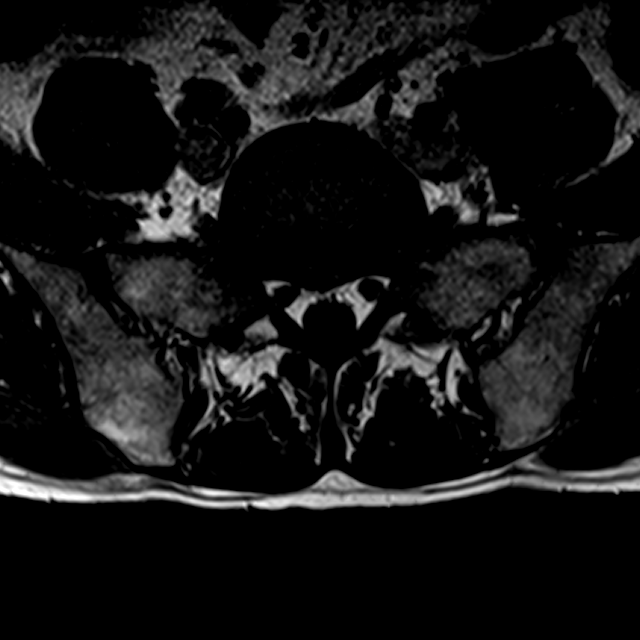
[im 22/42]
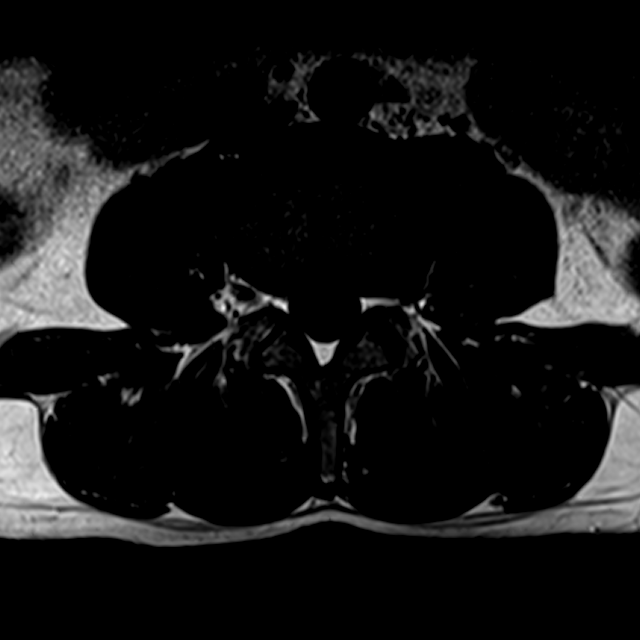
[im 36/42]
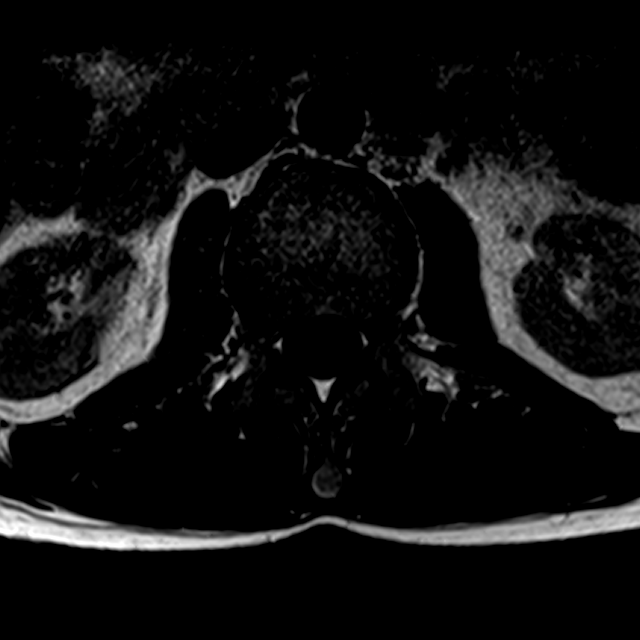

[15 of 48 positions shown; findings below may reference images not displayed]

FINDINGS: Segmentation:  Standard.

Alignment:  Normal.

Vertebrae: No fracture, suspicious osseous lesion, or significant
marrow edema.

Conus medullaris and cauda equina: Conus extends to the T12-L1
level. Conus and cauda equina appear normal.

Paraspinal and other soft tissues: Unremarkable.

Disc levels:

Mild lumbar disc desiccation most notable at L5-S1. At most mild
multilevel disc space narrowing.

L1-2: Negative.

L2-3: Large left foraminal and extraforaminal disc extrusion results
in moderate to severe left neural foraminal stenosis laterally and
impingement of the left L2 nerve within and lateral to the foramen.
Patent spinal canal and right neural foramen.

L3-4: A small left foraminal and extraforaminal disc protrusion
results in mild-to-moderate left neural foraminal stenosis and
contacts and displaces the exiting left L3 nerve. Patent spinal
canal and right neural foramen.

L4-5: Negative.

L5-S1: Negative.
IMPRESSION: 1. Large left foraminal and extraforaminal disc extrusion at L2-3
resulting in foraminal stenosis and L2 nerve impingement.
2. Smaller disc protrusion at L3-4 resulting in mild-to-moderate
left foraminal stenosis and potential left L3 nerve impingement.

## 2021-12-30 ENCOUNTER — Encounter (HOSPITAL_COMMUNITY): Payer: Self-pay | Admitting: Emergency Medicine

## 2021-12-30 ENCOUNTER — Emergency Department (HOSPITAL_COMMUNITY): Payer: BC Managed Care – PPO

## 2021-12-30 ENCOUNTER — Inpatient Hospital Stay (HOSPITAL_COMMUNITY)
Admission: EM | Admit: 2021-12-30 | Discharge: 2022-01-05 | DRG: 389 | Disposition: A | Discharge status: Home / Self Care | Payer: BC Managed Care – PPO | Attending: General Surgery | Admitting: General Surgery

## 2021-12-30 DIAGNOSIS — R933 Abnormal findings on diagnostic imaging of other parts of digestive tract: Secondary | ICD-10-CM

## 2021-12-30 DIAGNOSIS — E876 Hypokalemia: Secondary | ICD-10-CM | POA: Diagnosis not present

## 2021-12-30 DIAGNOSIS — K56609 Unspecified intestinal obstruction, unspecified as to partial versus complete obstruction: Principal | ICD-10-CM | POA: Diagnosis present

## 2021-12-30 DIAGNOSIS — Z87891 Personal history of nicotine dependence: Secondary | ICD-10-CM

## 2021-12-30 DIAGNOSIS — K603 Anal fistula: Secondary | ICD-10-CM | POA: Diagnosis present

## 2021-12-30 DIAGNOSIS — Z800 Family history of malignant neoplasm of digestive organs: Secondary | ICD-10-CM

## 2021-12-30 DIAGNOSIS — K640 First degree hemorrhoids: Secondary | ICD-10-CM | POA: Diagnosis present

## 2021-12-30 DIAGNOSIS — K56600 Partial intestinal obstruction, unspecified as to cause: Secondary | ICD-10-CM | POA: Diagnosis not present

## 2021-12-30 DIAGNOSIS — D649 Anemia, unspecified: Secondary | ICD-10-CM | POA: Diagnosis present

## 2021-12-30 DIAGNOSIS — E871 Hypo-osmolality and hyponatremia: Secondary | ICD-10-CM | POA: Diagnosis not present

## 2021-12-30 DIAGNOSIS — Z79899 Other long term (current) drug therapy: Secondary | ICD-10-CM

## 2021-12-30 DIAGNOSIS — K4090 Unilateral inguinal hernia, without obstruction or gangrene, not specified as recurrent: Secondary | ICD-10-CM | POA: Diagnosis present

## 2021-12-30 LAB — COMPREHENSIVE METABOLIC PANEL
ALT: 14 U/L (ref 0–44)
AST: 20 U/L (ref 15–41)
Albumin: 4.1 g/dL (ref 3.5–5.0)
Alkaline Phosphatase: 85 U/L (ref 38–126)
Anion gap: 10 (ref 5–15)
BUN: 14 mg/dL (ref 6–20)
CO2: 23 mmol/L (ref 22–32)
Calcium: 8.8 mg/dL — ABNORMAL LOW (ref 8.9–10.3)
Chloride: 102 mmol/L (ref 98–111)
Creatinine, Ser: 1.08 mg/dL (ref 0.61–1.24)
GFR, Estimated: >60 mL/min (ref >60)
Glucose, Bld: 100 mg/dL — ABNORMAL HIGH (ref 70–99)
Potassium: 3.6 mmol/L (ref 3.5–5.1)
Sodium: 135 mmol/L (ref 135–145)
Total Bilirubin: 0.7 mg/dL (ref 0.3–1.2)
Total Protein: 7.3 g/dL (ref 6.5–8.1)

## 2021-12-30 LAB — CBC
HCT: 41.5 % (ref 39.0–52.0)
Hemoglobin: 13.9 g/dL (ref 13.0–17.0)
MCH: 31.7 pg (ref 26.0–34.0)
MCHC: 33.5 g/dL (ref 30.0–36.0)
MCV: 94.7 fL (ref 80.0–100.0)
Platelets: 369 10*3/uL (ref 150–400)
RBC: 4.38 MIL/uL (ref 4.22–5.81)
RDW: 11.4 % — ABNORMAL LOW (ref 11.5–15.5)
WBC: 6.9 10*3/uL (ref 4.0–10.5)
nRBC: 0 % (ref 0.0–0.2)

## 2021-12-30 LAB — LIPASE, BLOOD: Lipase: 28 U/L (ref 11–51)

## 2021-12-30 MED ORDER — ACETAMINOPHEN 325 MG PO TABS
650.0000 mg | ORAL_TABLET | Freq: Four times a day (QID) | ORAL | Status: DC
  Administered 2022-01-02 – 2022-01-05 (×12): 650 mg via ORAL
  Filled 2021-12-30 (×14): qty 2

## 2021-12-30 MED ORDER — KETOROLAC TROMETHAMINE 15 MG/ML IJ SOLN
15.0000 mg | Freq: Three times a day (TID) | INTRAMUSCULAR | Status: AC
  Administered 2021-12-30 – 2022-01-04 (×14): 15 mg via INTRAVENOUS
  Filled 2021-12-30 (×14): qty 1

## 2021-12-30 MED ORDER — ENOXAPARIN SODIUM 40 MG/0.4ML IJ SOSY
40.0000 mg | PREFILLED_SYRINGE | INTRAMUSCULAR | Status: DC
  Administered 2021-12-31 – 2022-01-05 (×2): 40 mg via SUBCUTANEOUS
  Filled 2021-12-30 (×5): qty 0.4

## 2021-12-30 MED ORDER — LACTATED RINGERS IV SOLN: INTRAVENOUS | Status: DC

## 2021-12-30 MED ORDER — PROCHLORPERAZINE EDISYLATE 10 MG/2ML IJ SOLN
10.0000 mg | INTRAMUSCULAR | Status: DC | PRN
  Administered 2021-12-31: 10 mg via INTRAVENOUS
  Filled 2021-12-30: qty 2

## 2021-12-30 MED ORDER — IOHEXOL 350 MG/ML SOLN
75.0000 mL | Freq: Once | INTRAVENOUS | Status: AC | PRN
  Administered 2021-12-30: 75 mL via INTRAVENOUS

## 2021-12-30 MED ORDER — METHOCARBAMOL 1000 MG/10ML IJ SOLN: 500.0000 mg | Freq: Four times a day (QID) | INTRAVENOUS | Status: DC | PRN

## 2021-12-30 MED ORDER — HYDROMORPHONE HCL 1 MG/ML IJ SOLN
0.5000 mg | INTRAMUSCULAR | Status: DC | PRN
  Administered 2021-12-30 – 2022-01-05 (×17): 0.5 mg via INTRAVENOUS
  Filled 2021-12-30 (×8): qty 0.5
  Filled 2021-12-30: qty 1
  Filled 2021-12-30 (×3): qty 0.5
  Filled 2021-12-30: qty 1
  Filled 2021-12-30 (×3): qty 0.5
  Filled 2021-12-30: qty 1

## 2021-12-30 MED ORDER — OXYCODONE-ACETAMINOPHEN 5-325 MG PO TABS
1.0000 | ORAL_TABLET | ORAL | Status: AC | PRN
  Administered 2021-12-30 – 2022-01-02 (×2): 1 via ORAL
  Filled 2021-12-30 (×2): qty 1

## 2021-12-30 MED ORDER — OXYCODONE HCL 5 MG PO TABS
10.0000 mg | ORAL_TABLET | ORAL | Status: DC | PRN
  Administered 2021-12-31 – 2022-01-03 (×3): 10 mg via ORAL
  Filled 2021-12-30 (×3): qty 2

## 2021-12-30 MED ORDER — SIMETHICONE 80 MG PO CHEW: 80.0000 mg | CHEWABLE_TABLET | Freq: Four times a day (QID) | ORAL | Status: DC | PRN

## 2021-12-30 MED ORDER — ONDANSETRON HCL 4 MG/2ML IJ SOLN
4.0000 mg | Freq: Four times a day (QID) | INTRAMUSCULAR | Status: DC | PRN
  Administered 2021-12-31 – 2022-01-01 (×2): 4 mg via INTRAVENOUS
  Filled 2021-12-30 (×2): qty 2

## 2021-12-30 MED ORDER — OXYCODONE HCL 5 MG PO TABS: 5.0000 mg | ORAL_TABLET | ORAL | Status: DC | PRN

## 2021-12-30 MED ORDER — ONDANSETRON 4 MG PO TBDP
4.0000 mg | ORAL_TABLET | Freq: Once | ORAL | Status: AC | PRN
  Administered 2021-12-30: 4 mg via ORAL
  Filled 2021-12-30: qty 1

## 2021-12-30 MED ORDER — DIATRIZOATE MEGLUMINE & SODIUM 66-10 % PO SOLN
90.0000 mL | Freq: Once | ORAL | Status: AC
  Administered 2021-12-31: 90 mL via NASOGASTRIC
  Filled 2021-12-30: qty 90

## 2021-12-30 NOTE — ED Provider Triage Note (Signed)
Emergency Medicine Provider Triage Evaluation Note  George Clark , a 51 y.o. male  was evaluated in triage.  Pt complains of abd pain. Report mid abd pain started this AM with nausea and vomiting.  Endorse chills, no fever, no cp, sob, cough, dysuria.  Prior appendectomy.  No hx of SBO  Review of Systems  Positive: As above Negative: As above  Physical Exam  BP 119/78 (BP Location: Right Arm)   Pulse 83   Temp 98.7 F (37.1 C) (Oral)   Resp 16   SpO2 100%  Gen:   Awake, no distress   Resp:  Normal effort  MSK:   Moves extremities without difficulty  Other:  Appears uncomfortable  Medical Decision Making  Medically screening exam initiated at 4:43 PM.  Appropriate orders placed.  Helton Oleson was informed that the remainder of the evaluation will be completed by another provider, this initial triage assessment does not replace that evaluation, and the importance of remaining in the ED until their evaluation is complete.     Domenic Moras, PA-C 12/30/21 1644

## 2021-12-30 NOTE — ED Triage Notes (Signed)
Patient here with complaint of sharp central abdominal pain that started at 0400 this morning. History of appendectomy, denies any other abdominal surgery. Patient reports nausea and vomiting, denies diarrhea, states he has not had a bowel movement since Saturday morning. Patient is alert, oriented, and unable to find a comfortable position in his chair.

## 2021-12-30 NOTE — ED Notes (Signed)
Patient updated on plan of care

## 2021-12-30 NOTE — H&P (Signed)
Admitting Physician: Nickola Major Jermanie Minshall  Service: General surgery  CC: abdominal pain, nausea and vomiting  Subjective   HPI: George Clark is an 51 y.o. male who is here for abdominal pain, nausea and vomiting.  His symptoms started in the night last night.  Nausea, vomiting mid abdominal pain.  The pain worsened throughout the day.  He had a bowel movement on Saturday, no gas or stool today.  Vomited this morning, but nothing since then.  Now he seems to just have heartburn/acid into his throat feeling, no nausea or vomiting.  Pain improved some since pain medication but now returning.     He has a history of perforated appendicitis.  He has a history of anal fistula surgery.  He has a finicky GI track according to his wife though he denies and minimizes his symptoms.  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  He had a colonoscopy years ago and just found a polyp, no other abnormalities - maybe 7-10 years ago.  His son also has a finicky GI track but nobody in the family has IBD or a history of colon cancer.   Past Medical History:  Diagnosis Date   Gallstones    Dx last year    Past Surgical History:  Procedure Laterality Date   APPENDECTOMY  1998   TREATMENT FISTULA ANAL  2011    Family History  Problem Relation Age of Onset   Pancreatic cancer Father 11   Colon cancer Neg Hx     Social:  reports that he has never smoked. He quit smokeless tobacco use about 13 years ago.  His smokeless tobacco use included chew. He reports that he does not drink alcohol and does not use drugs.  Allergies: No Known Allergies  Medications: Current Outpatient Medications  Medication Instructions   ibuprofen (ADVIL) 200 mg, Oral, Every 6 hours PRN   omeprazole (PRILOSEC) 20 mg, Oral, Daily    ROS - all of the below systems have been reviewed with the patient and positives are indicated with bold text General: chills, fever or night  sweats Eyes: blurry vision or double vision ENT: epistaxis or sore throat Allergy/Immunology: itchy/watery eyes or nasal congestion Hematologic/Lymphatic: bleeding problems, blood clots or swollen lymph nodes Endocrine: temperature intolerance or unexpected weight changes Breast: new or changing breast lumps or nipple discharge Resp: cough, shortness of breath, or wheezing CV: chest pain or dyspnea on exertion GI: as per HPI GU: dysuria, trouble voiding, or hematuria MSK: joint pain or joint stiffness Neuro: TIA or stroke symptoms Derm: pruritus and skin lesion changes Psych: anxiety and depression  Objective   PE Blood pressure 111/77, pulse 68, temperature 97.6 F (36.4 C), temperature source Oral, resp. rate 15, SpO2 98 %. Constitutional: NAD; conversant; no deformities Eyes: Moist conjunctiva; no lid lag; anicteric; PERRL Neck: Trachea midline; no thyromegaly Lungs: Normal respiratory effort; no tactile fremitus CV: RRR; no palpable thrills; no pitting edema GI: Abd Soft, mild tenderness, mild distention; no palpable hepatosplenomegaly MSK: Normal range of motion of extremities; no clubbing/cyanosis Psychiatric: Appropriate affect; alert and oriented x3 Lymphatic: No palpable cervical or axillary lymphadenopathy  Results for orders placed or performed during the hospital encounter of 12/30/21 (from the past 24 hour(s))  Lipase, blood     Status: None   Collection Time: 12/30/21  5:04 PM  Result Value Ref Range   Lipase 28 11 - 51 U/L  Comprehensive metabolic panel  Status: Abnormal   Collection Time: 12/30/21  5:04 PM  Result Value Ref Range   Sodium 135 135 - 145 mmol/L   Potassium 3.6 3.5 - 5.1 mmol/L   Chloride 102 98 - 111 mmol/L   CO2 23 22 - 32 mmol/L   Glucose, Bld 100 (H) 70 - 99 mg/dL   BUN 14 6 - 20 mg/dL   Creatinine, Ser 6.43 0.61 - 1.24 mg/dL   Calcium 8.8 (L) 8.9 - 10.3 mg/dL   Total Protein 7.3 6.5 - 8.1 g/dL   Albumin 4.1 3.5 - 5.0 g/dL   AST 20  15 - 41 U/L   ALT 14 0 - 44 U/L   Alkaline Phosphatase 85 38 - 126 U/L   Total Bilirubin 0.7 0.3 - 1.2 mg/dL   GFR, Estimated >32 >95 mL/min   Anion gap 10 5 - 15  CBC     Status: Abnormal   Collection Time: 12/30/21  5:04 PM  Result Value Ref Range   WBC 6.9 4.0 - 10.5 K/uL   RBC 4.38 4.22 - 5.81 MIL/uL   Hemoglobin 13.9 13.0 - 17.0 g/dL   HCT 18.8 41.6 - 60.6 %   MCV 94.7 80.0 - 100.0 fL   MCH 31.7 26.0 - 34.0 pg   MCHC 33.5 30.0 - 36.0 g/dL   RDW 30.1 (L) 60.1 - 09.3 %   Platelets 369 150 - 400 K/uL   nRBC 0.0 0.0 - 0.2 %    Imaging Orders         CT ABDOMEN PELVIS W CONTRAST    1. Wall thickening of the terminal ileum worrisome for inflammatory bowel disease. This causes small bowel obstruction with transition point in the right lower quadrant. Mesenteric edema is present which can be seen with vascular compromise. 2. Small amount of free fluid in the pelvis. 3. Cholelithiasis.  Assessment and Plan   [name] is an 51 y.o. male with a small bowel obstruction.  I recommended NG tube with small bowel protocol. This may be an adhesive bowel obstruction as he has a history of perforated appendicitis.  CT is concerning for Crohn's disease and he has a history of issues with anal fistulas, no history of Crohn's in the family but his son also has a finicky GI track.  We will consult GI for evaluation.     ICD-10-CM   1. Small bowel obstruction (HCC)  K56.609        Quentin Ore, MD  [facility] Use [email or web address] to contact on call provider  New Patient Billing: [id number] - High MDM

## 2021-12-30 NOTE — ED Provider Notes (Signed)
[facility] EMERGENCY DEPARTMENT Provider Note   CSN: [id number] Arrival date & time: 12/30/21  1508     History  Chief Complaint  Patient presents with   Abdominal Pain    George Clark is a 51 y.o. male.  51 year old male with no significant past medical history presents with complaint of abdominal pain.  Reports sudden onset right lower abdominal pain around 4 AM this morning waking him from his sleep.  Notes that he had vomiting later in the morning.  Last bowel movement was yesterday morning, not passing gas today.  Pain waxed and waned in severity today and then became constant and severe around 1 or so which prompted patient to come to the emergency room.  Patient was given Percocet in triage, pain has improved somewhat although still has some discomfort at this time.  Prior abdominal surgeries include appendectomy.  No family history of Crohn's disease, IBS, ulcerative colitis.  Non-smoker, does not drink alcohol regularly.       Home Medications Prior to Admission medications   Medication Sig Start Date End Date Taking? Authorizing Provider  ibuprofen (ADVIL,MOTRIN) 100 MG tablet Take 200 mg by mouth every 6 (six) hours as needed for fever (as needed).    [provider]  omeprazole (PRILOSEC) 20 MG capsule Take 1 capsule (20 mg total) by mouth daily. Patient not taking: Reported on 12/28/2015 11/01/14   George Paganini, NP      Allergies    Patient has no known allergies.    Review of Systems   Review of Systems Negative except as per HPI Physical Exam Updated Vital Signs BP 107/69 (BP Location: Right Arm)   Pulse 75   Temp 98.7 F (37.1 C) (Oral)   Resp 20   SpO2 97%  Physical Exam Vitals and nursing note reviewed.  Constitutional:      General: He is not in acute distress.    Appearance: He is well-developed. He is not diaphoretic.  HENT:     Head: Normocephalic and atraumatic.  Cardiovascular:     Rate and Rhythm: Normal rate  and regular rhythm.     Heart sounds: Normal heart sounds.  Pulmonary:     Effort: Pulmonary effort is normal.     Breath sounds: Normal breath sounds.  Abdominal:     General: Bowel sounds are decreased.     Palpations: Abdomen is soft.     Tenderness: There is abdominal tenderness in the right lower quadrant.  Skin:    General: Skin is warm and dry.     Findings: No erythema or rash.  Neurological:     Mental Status: He is alert and oriented to person, place, and time.  Psychiatric:        Behavior: Behavior normal.     ED Results / Procedures / Treatments   Labs (all labs ordered are listed, but only abnormal results are displayed) Labs Reviewed  COMPREHENSIVE METABOLIC PANEL - Abnormal; Notable for the following components:      Result Value   Glucose, Bld 100 (*)    Calcium 8.8 (*)    All other components within normal limits  CBC - Abnormal; Notable for the following components:   RDW 11.4 (*)    All other components within normal limits  LIPASE, BLOOD  URINALYSIS, ROUTINE W REFLEX MICROSCOPIC    EKG None  Radiology CT ABDOMEN PELVIS W CONTRAST  Result Date: 12/30/2021 CLINICAL DATA:  Evaluate for bowel obstruction. EXAM: CT ABDOMEN  AND PELVIS WITH CONTRAST TECHNIQUE: Multidetector CT imaging of the abdomen and pelvis was performed using the standard protocol following bolus administration of intravenous contrast. RADIATION DOSE REDUCTION: This exam was performed according to the departmental dose-optimization program which includes automated exposure control, adjustment of the mA and/or kV according to patient size and/or use of iterative reconstruction technique. CONTRAST:  91mL OMNIPAQUE IOHEXOL 350 MG/ML SOLN COMPARISON:  CT abdomen and pelvis 11/30/2014 FINDINGS: Lower chest: No acute abnormality. Hepatobiliary: Gallstones are present. There is no biliary ductal dilatation. The liver is within normal limits. Pancreas: Unremarkable. No pancreatic ductal dilatation  or surrounding inflammatory changes. Spleen: Normal in size without focal abnormality. Adrenals/Urinary Tract: Adrenal glands are unremarkable. Kidneys are normal, without renal calculi, focal lesion, or hydronephrosis. Bladder is unremarkable. Stomach/Bowel: There is wall thickening of the terminal ileum. This is transition point for small bowel obstruction. Small bowel loops proximal to this level are mildly dilated with mesenteric edema. Stomach is nondilated. Colon is nondilated. Appendix is surgically absent. No pneumatosis or free air. Vascular/Lymphatic: No significant vascular findings are present. No enlarged abdominal or pelvic lymph nodes. Reproductive: Prostate is unremarkable. Other: Small amount of free fluid in the pelvis. No focal abdominal wall hernia. Musculoskeletal: No acute or significant osseous findings. IMPRESSION: 1. Wall thickening of the terminal ileum worrisome for inflammatory bowel disease. This causes small bowel obstruction with transition point in the right lower quadrant. Mesenteric edema is present which can be seen with vascular compromise. 2. Small amount of free fluid in the pelvis. 3. Cholelithiasis. Electronically Signed   By: Ronney Asters M.D.   On: 12/30/2021 18:48    Procedures Procedures    Medications Ordered in ED Medications  oxyCODONE-acetaminophen (PERCOCET/ROXICET) 5-325 MG per tablet 1 tablet (1 tablet Oral Given 12/30/21 1642)  ondansetron (ZOFRAN-ODT) disintegrating tablet 4 mg (4 mg Oral Given 12/30/21 1642)  iohexol (OMNIPAQUE) 350 MG/ML injection 75 mL (75 mLs Intravenous Contrast Given 12/30/21 1838)    ED Course/ Medical Decision Making/ A&P                           Medical Decision Making Amount and/or Complexity of Data Reviewed Labs: ordered.  Risk Prescription drug management. Decision regarding hospitalization.   This patient presents to the ED for concern of abdominal pain, nausea, vomiting, this involves an extensive number  of treatment options, and is a complaint that carries with it a high risk of complications and morbidity.  The differential diagnosis includes but not limited to bowel obstruction, colitis,   Co morbidities that complicate the patient evaluation  Non-smoker, does not drink alcohol regularly, otherwise healthy.  Prior abdominal surgery limited to appendectomy   Additional history obtained:  Additional history obtained from family member at bedside who contributes to history as above External records from outside source obtained and reviewed including no recent relevant records on file for review and comparison   Lab Tests:  I Ordered, and personally interpreted labs.  The pertinent results include: CBC unremarkable, lipase normal, CMP without significant findings.   Imaging Studies ordered:  I ordered imaging studies including CT abdomen pelvis I independently visualized and interpreted imaging which showed concern for SBO I agree with the radiologist interpretation  Consultations Obtained:  I requested consultation with the general surgery, Dr. Thermon Leyland,  and discussed lab and imaging findings as well as pertinent plan - they recommend: Admit under SBO protocol, plan for general surgery to admit   Problem  List / ED Course / Critical interventions / Medication management  51 year old otherwise healthy male with complaint of sudden onset abdominal pain which woke him from his sleep at 4 AM today.  Did have some vomiting earlier today.  Pain became progressively worse after 1 PM which prompted him to come to the emergency room.  Patient was given Percocet in triage with improvement in his symptoms although still has some abdominal discomfort.  Notes that he has not had a bowel movement since yesterday, is not passing gas today.  Has had prior abdominal surgery.  He is found to have a small bowel obstruction.  Reviewed CT report with general surgery who will see the patient with plan  to admit.  Labs unremarkable. I ordered medication including Percocet for pain Reevaluation of the patient after these medicines showed that the patient improved I have reviewed the patients home medicines and have made adjustments as needed   Social Determinants of Health:  Lives with family   Test / Admission - Considered:  Admit for further care         Final Clinical Impression(s) / ED Diagnoses Final diagnoses:  Small bowel obstruction [facility])    Rx / DC Orders ED Discharge Orders     None         Alden Hipp 12/30/21 2033    Pricilla Loveless, MD 01/01/22 [phone]

## 2021-12-31 ENCOUNTER — Encounter (HOSPITAL_COMMUNITY): Payer: Self-pay

## 2021-12-31 ENCOUNTER — Inpatient Hospital Stay (HOSPITAL_COMMUNITY): Payer: BC Managed Care – PPO

## 2021-12-31 DIAGNOSIS — R933 Abnormal findings on diagnostic imaging of other parts of digestive tract: Secondary | ICD-10-CM

## 2021-12-31 DIAGNOSIS — K56609 Unspecified intestinal obstruction, unspecified as to partial versus complete obstruction: Secondary | ICD-10-CM

## 2021-12-31 LAB — CBC
HCT: 39.5 % (ref 39.0–52.0)
Hemoglobin: 13.3 g/dL (ref 13.0–17.0)
MCH: 31.7 pg (ref 26.0–34.0)
MCHC: 33.7 g/dL (ref 30.0–36.0)
MCV: 94.3 fL (ref 80.0–100.0)
Platelets: 333 10*3/uL (ref 150–400)
RBC: 4.19 MIL/uL — ABNORMAL LOW (ref 4.22–5.81)
RDW: 11.6 % (ref 11.5–15.5)
WBC: 7.9 10*3/uL (ref 4.0–10.5)
nRBC: 0 % (ref 0.0–0.2)

## 2021-12-31 LAB — BASIC METABOLIC PANEL
Anion gap: 9 (ref 5–15)
BUN: 13 mg/dL (ref 6–20)
CO2: 24 mmol/L (ref 22–32)
Calcium: 8.5 mg/dL — ABNORMAL LOW (ref 8.9–10.3)
Chloride: 100 mmol/L (ref 98–111)
Creatinine, Ser: 1.09 mg/dL (ref 0.61–1.24)
GFR, Estimated: >60 mL/min (ref >60)
Glucose, Bld: 170 mg/dL — ABNORMAL HIGH (ref 70–99)
Potassium: 3.2 mmol/L — ABNORMAL LOW (ref 3.5–5.1)
Sodium: 133 mmol/L — ABNORMAL LOW (ref 135–145)

## 2021-12-31 LAB — HIV ANTIBODY (ROUTINE TESTING W REFLEX): HIV Screen 4th Generation wRfx: NONREACTIVE

## 2021-12-31 NOTE — Progress Notes (Signed)
Subjective: Patient states he is still having pain about a 6 that is controlled with pain medication.  Thinks the pain medication will help his obstruction so he doesn't have to get an NGT.  Explained that is not how this type of thing works and that the NGT will help with decompression which can help relieve some of his symptoms but also if this obstruction is secondary to adhesive disease, can help that as well.  Still doesn't want one right now.  ROS: See above, otherwise other systems negative  Objective: Vital signs in last 24 hours: Temp:  [97.6 F (36.4 C)-98.7 F (37.1 C)] 98.7 F (37.1 C) (10/16 0553) Pulse Rate:  [59-91] 63 (10/16 0739) Resp:  [9-36] 13 (10/16 0739) BP: (100-124)/(62-84) 100/66 (10/16 0738) SpO2:  [94 %-100 %] 96 % (10/16 0739)    Intake/Output from previous day: No intake/output data recorded. Intake/Output this shift: No intake/output data recorded.  PE: Gen: NAD Heart: regular Lungs: CTAB Abd: soft, still with some voluntary guarding with palpation, particularly in RLQ, mild distention Psych: A&Ox3  Lab Results:  Recent Labs    12/30/21 1704 12/31/21 0110  WBC 6.9 7.9  HGB 13.9 13.3  HCT 41.5 39.5  PLT 369 333   BMET Recent Labs    12/30/21 1704 12/31/21 0110  NA 135 133*  K 3.6 3.2*  CL 102 100  CO2 23 24  GLUCOSE 100* 170*  BUN 14 13  CREATININE 1.08 1.09  CALCIUM 8.8* 8.5*   PT/INR No results for input(s): "LABPROT", "INR" in the last 72 hours. CMP     Component Value Date/Time   NA 133 (L) 12/31/2021 0110   K 3.2 (L) 12/31/2021 0110   CL 100 12/31/2021 0110   CO2 24 12/31/2021 0110   GLUCOSE 170 (H) 12/31/2021 0110   BUN 13 12/31/2021 0110   CREATININE 1.09 12/31/2021 0110   CREATININE 1.08 11/01/2014 0001   CALCIUM 8.5 (L) 12/31/2021 0110   PROT 7.3 12/30/2021 1704   ALBUMIN 4.1 12/30/2021 1704   AST 20 12/30/2021 1704   ALT 14 12/30/2021 1704   ALKPHOS 85 12/30/2021 1704   BILITOT 0.7 12/30/2021 1704    GFRNONAA >60 12/31/2021 0110   GFRAA >60 12/28/2015 0931   Lipase     Component Value Date/Time   LIPASE 28 12/30/2021 1704       Studies/Results: CT ABDOMEN PELVIS W CONTRAST  Result Date: 12/30/2021 CLINICAL DATA:  Evaluate for bowel obstruction. EXAM: CT ABDOMEN AND PELVIS WITH CONTRAST TECHNIQUE: Multidetector CT imaging of the abdomen and pelvis was performed using the standard protocol following bolus administration of intravenous contrast. RADIATION DOSE REDUCTION: This exam was performed according to the departmental dose-optimization program which includes automated exposure control, adjustment of the mA and/or kV according to patient size and/or use of iterative reconstruction technique. CONTRAST:  67mL OMNIPAQUE IOHEXOL 350 MG/ML SOLN COMPARISON:  CT abdomen and pelvis 11/30/2014 FINDINGS: Lower chest: No acute abnormality. Hepatobiliary: Gallstones are present. There is no biliary ductal dilatation. The liver is within normal limits. Pancreas: Unremarkable. No pancreatic ductal dilatation or surrounding inflammatory changes. Spleen: Normal in size without focal abnormality. Adrenals/Urinary Tract: Adrenal glands are unremarkable. Kidneys are normal, without renal calculi, focal lesion, or hydronephrosis. Bladder is unremarkable. Stomach/Bowel: There is wall thickening of the terminal ileum. This is transition point for small bowel obstruction. Small bowel loops proximal to this level are mildly dilated with mesenteric edema. Stomach is nondilated. Colon is nondilated.  Appendix is surgically absent. No pneumatosis or free air. Vascular/Lymphatic: No significant vascular findings are present. No enlarged abdominal or pelvic lymph nodes. Reproductive: Prostate is unremarkable. Other: Small amount of free fluid in the pelvis. No focal abdominal wall hernia. Musculoskeletal: No acute or significant osseous findings. IMPRESSION: 1. Wall thickening of the terminal ileum worrisome for  inflammatory bowel disease. This causes small bowel obstruction with transition point in the right lower quadrant. Mesenteric edema is present which can be seen with vascular compromise. 2. Small amount of free fluid in the pelvis. 3. Cholelithiasis. Electronically Signed   By: Ronney Asters M.D.   On: 12/30/2021 18:48    Anti-infectives: Anti-infectives (From admission, onward)    None        Assessment/Plan SBO, crohns vs adhesive -history of anal fistula -CT scan worrisome for crohn's disease.  GI consult pending -continue to encourage NGT.  8 hr delay film with contrast throughout the SB and still dilated.  -cont NPO -mobilize, pulm toilet   FEN - NPO, IVFs VTE - lovenox ID - none currently needed  Anal fistula  I reviewed nursing notes, last 24 h vitals and pain scores, last 48 h intake and output, last 24 h labs and trends, and last 24 h imaging results.   LOS: 1 day    Henreitta Cea , [facility] 12/31/2021, 9:05 AM Please see Amion for pager number during day hours 7:00am-4:30pm or 7:00am -11:30am on weekends

## 2021-12-31 NOTE — Consult Note (Addendum)
[facility] Consult: 10:07 AM 12/31/2021  LOS: 1 day    Referring Provider: CCS  Primary Care Physician:  Pcp, No Primary Gastroenterologist:  Dr. Eula Listen, [facility]    Reason for Consultation:  SBO, ? Crohns   HPI: George Clark is a 51 y.o. male.  PMH: glucose intolerance.  Gallstones.  Cholelithiasis, seen on CTAP (for abd pain) in 11/2013.  HIDA scan negative.  S/p appendectomy. Treatment/surgery for a anal fistula 2011. Remote colonoscopy ~ 2010 w Dr. Kinnie Scales, for rectal bleeding.  Old notes mention no significant findings and another note says possibly had a polyp but was told to have a follow-up in 10 years.  Do not find a procedure report or pathology relating to this..  At baseline has few GI symptoms.  Because of his busy work schedule, he often skips meals. Went to a late football game in [place] Saturday.  That day he had eaten peanuts and nachos from a game vendor.  Got back home around 1 AM.  4 AM woke up with acute periumbilical pain and had a single episode of green bilious nausea and vomiting.  Nausea intermittently since then but no further emesis.  Pain persists. No one else in the family is sick.  WBCs normal.  Hgb 13.3.  MCV 94.   Na 133.  K 3.2.  Glucose 170.  LFTs, Lipase normal.   CTAP w contrast: Wall thickening at TI worrisome for IBD, this area causing small bowel obstruction.  Mesenteric edema could represent vascular compromise.  Small pelvic free fluid.  Uncomplicated cholelithiasis. KUB today shows dilated loops of small bowel suggesting ileus or partial obstruction.  Family history negative for IBD.  Father died age 18 with pancreatic cancer.  Does not smoke.  No EtOH. Married.  Lives with his wife.  Tobacco farmer.  Past Medical History:  Diagnosis Date    Gallstones    Dx last year    Past Surgical History:  Procedure Laterality Date   APPENDECTOMY  1998   TREATMENT FISTULA ANAL  2011    Prior to Admission medications   Medication Sig Start Date End Date Taking? Authorizing Provider  HYDROcodone-acetaminophen (NORCO/VICODIN) 5-325 MG tablet Take 1/2 tablet by mouth twice a day as needed for moderate pain   Yes [provider]  ibuprofen (ADVIL) 200 MG tablet Take 400 mg by mouth daily as needed for fever (as needed).   Yes [provider]    Scheduled Meds:  acetaminophen  650 mg Oral Q6H   enoxaparin (LOVENOX) injection  40 mg Subcutaneous Q24H   ketorolac  15 mg Intravenous Q8H   Infusions:  lactated ringers 100 mL/hr at 12/31/21 0108   methocarbamol (ROBAXIN) IV     PRN Meds: HYDROmorphone (DILAUDID) injection, methocarbamol (ROBAXIN) IV, ondansetron (ZOFRAN) IV, oxyCODONE, oxyCODONE, oxyCODONE-acetaminophen, prochlorperazine, simethicone   Allergies as of 12/30/2021   (No Known Allergies)    Family History  Problem Relation Age of Onset   Pancreatic cancer Father 35   Colon cancer Neg Hx     Social  History   Socioeconomic History   Marital status: Married    Spouse name: Not on file   Number of children: Not on file   Years of education: Not on file   Highest education level: Not on file  Occupational History   Not on file  Tobacco Use   Smoking status: Never   Smokeless tobacco: Former    Types: Chew    Quit date: 10/31/2008  Substance and Sexual Activity   Alcohol use: No    Alcohol/week: 0.0 standard drinks of alcohol   Drug use: No   Sexual activity: Not on file  Other Topics Concern   Not on file  Social History Narrative   Not on file   Social Determinants of Health   Financial Resource Strain: Not on file  Food Insecurity: Not on file  Transportation Needs: Not on file  Physical Activity: Not on file  Stress: Not on file  Social Connections: Not on file  Intimate  Partner Violence: Not on file    REVIEW OF SYSTEMS: Constitutional: Generally good energy.  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 ENT:  No nose bleeds Pulm: No shortness of breath.  No cough. CV:  No palpitations, no LE edema.  No chest pain or palpitations. GU:  No hematuria, no frequency GI: See HPI. Heme: Unusual bleeding or bruising. Transfusions: None. Neuro:  No headaches, no peripheral tingling or numbness.  No syncope, no seizures. Derm:  No itching, no rash or sores.  Endocrine:  No sweats or chills.  No polyuria or dysuria Immunization: Not queried, none on file. Travel: Not queried.   PHYSICAL EXAM: Vital signs in last 24 hours: Vitals:   12/31/21 0738 12/31/21 0739  BP: 100/66   Pulse: (!) 59 63  Resp:  13  Temp:    SpO2: 95% 96%   Wt Readings from Last 3 Encounters:  11/01/14 60.2 kg    General: NAD.  Looks somewhat uncomfortable and mildly ill. Head: No facial asymmetry or swelling.  No signs of head trauma. Eyes: No conjunctival pallor.  No scleral icterus. Ears: No hearing deficit Nose: No congestion or discharge Mouth: Moist, clear, pink oral mucosa.  Tongue midline.  Good dentition. Neck: No JVD, no masses, no thyromegaly. Lungs: No cough or labored breathing.  Lungs clear bilaterally. Heart: RRR.  No MRG.  S1, S2 present. Abdomen: Mild tenderness but no guarding or rebound around the periumbilical area.  No distention.  Bowel sounds active without high-pitched or tinkling/tympanitic quality..   Rectal: Deferred. Musc/Skeltl: No joint redness, swelling or gross deformity. Extremities: No CCE. Neurologic: Oriented x3.  Moves all 4 limbs. Skin: No rash, no sores, no suspicious lesions.  Skin is overall deeply tanned and shows changes of sun exposure. Nodes: No cervical adenopathy Psych: No agitation.  Somewhat disengaged but does answer question.  His wife gave most of the history.  Intake/Output from previous  day: No intake/output data recorded. Intake/Output this shift: No intake/output data recorded.  LAB RESULTS: Recent Labs    12/30/21 1704 12/31/21 0110  WBC 6.9 7.9  HGB 13.9 13.3  HCT 41.5 39.5  PLT 369 333   BMET Lab Results  Component Value Date   NA 133 (L) 12/31/2021   NA 135 12/30/2021   NA 137 12/28/2015   K 3.2 (L) 12/31/2021   K 3.6 12/30/2021   K 3.3 (L) 12/28/2015   CL 100 12/31/2021   CL 102 12/30/2021  CL 102 12/28/2015   CO2 24 12/31/2021   CO2 23 12/30/2021   CO2 26 12/28/2015   GLUCOSE 170 (H) 12/31/2021   GLUCOSE 100 (H) 12/30/2021   GLUCOSE 112 (H) 12/28/2015   BUN 13 12/31/2021   BUN 14 12/30/2021   BUN 18 12/28/2015   CREATININE 1.09 12/31/2021   CREATININE 1.08 12/30/2021   CREATININE 1.15 12/28/2015   CALCIUM 8.5 (L) 12/31/2021   CALCIUM 8.8 (L) 12/30/2021   CALCIUM 9.1 12/28/2015   LFT Recent Labs    12/30/21 1704  PROT 7.3  ALBUMIN 4.1  AST 20  ALT 14  ALKPHOS 85  BILITOT 0.7   PT/INR No results found for: "INR", "PROTIME" Hepatitis Panel No results for input(s): "HEPBSAG", "HCVAB", "HEPAIGM", "HEPBIGM" in the last 72 hours. C-Diff No components found for: "CDIFF" Lipase     Component Value Date/Time   LIPASE 28 12/30/2021 1704    Drugs of Abuse  No results found for: "LABOPIA", "COCAINSCRNUR", "LABBENZ", "AMPHETMU", "THCU", "LABBARB"   RADIOLOGY STUDIES: DG Abd Portable 1V-Small Bowel Obstruction Protocol-initial, 8 hr delay  Result Date: 12/31/2021 CLINICAL DATA:  8 hour delayed image for small-bowel series EXAM: PORTABLE ABDOMEN - 1 VIEW COMPARISON:  CT done on 12/30/2021 FINDINGS: There is moderate dilation of small-bowel loops measuring up to 3.6 cm in diameter. Contrast remains in stomach and dilated small bowel loops. There is contrast in urinary bladder from previous CT. Gas is present in the lumen of colon. Colon is not distended. IMPRESSION: There is dilation of small-bowel loops suggesting ileus or partial  obstruction. Electronically Signed   By: Elmer Picker M.D.   On: 12/31/2021 09:13   CT ABDOMEN PELVIS W CONTRAST  Result Date: 12/30/2021 CLINICAL DATA:  Evaluate for bowel obstruction. EXAM: CT ABDOMEN AND PELVIS WITH CONTRAST TECHNIQUE: Multidetector CT imaging of the abdomen and pelvis was performed using the standard protocol following bolus administration of intravenous contrast. RADIATION DOSE REDUCTION: This exam was performed according to the departmental dose-optimization program which includes automated exposure control, adjustment of the mA and/or kV according to patient size and/or use of iterative reconstruction technique. CONTRAST:  66mL OMNIPAQUE IOHEXOL 350 MG/ML SOLN COMPARISON:  CT abdomen and pelvis 11/30/2014 FINDINGS: Lower chest: No acute abnormality. Hepatobiliary: Gallstones are present. There is no biliary ductal dilatation. The liver is within normal limits. Pancreas: Unremarkable. No pancreatic ductal dilatation or surrounding inflammatory changes. Spleen: Normal in size without focal abnormality. Adrenals/Urinary Tract: Adrenal glands are unremarkable. Kidneys are normal, without renal calculi, focal lesion, or hydronephrosis. Bladder is unremarkable. Stomach/Bowel: There is wall thickening of the terminal ileum. This is transition point for small bowel obstruction. Small bowel loops proximal to this level are mildly dilated with mesenteric edema. Stomach is nondilated. Colon is nondilated. Appendix is surgically absent. No pneumatosis or free air. Vascular/Lymphatic: No significant vascular findings are present. No enlarged abdominal or pelvic lymph nodes. Reproductive: Prostate is unremarkable. Other: Small amount of free fluid in the pelvis. No focal abdominal wall hernia. Musculoskeletal: No acute or significant osseous findings. IMPRESSION: 1. Wall thickening of the terminal ileum worrisome for inflammatory bowel disease. This causes small bowel obstruction with  transition point in the right lower quadrant. Mesenteric edema is present which can be seen with vascular compromise. 2. Small amount of free fluid in the pelvis. 3. Cholelithiasis. Electronically Signed   By: Ronney Asters M.D.   On: 12/30/2021 18:48      IMPRESSION:   Acute onset mid abdominal pain with nausea and  vomiting.  Imaging studies show SBO/PSBO with inflammation at the TI causing at least a partial upstream obstruction.  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  Picture raises concern for Crohn's disease/IBD.  However patient has no chronic symptoms to suggest IBD.  ?  Acute intestinal illness, viral?  Hyponatremia  Hypokalemia.    PLAN:     Discussed need for colonoscopy but patient does not feel that he could tolerate the bowel prep at present and so this will have to wait.   Jennye Moccasin  12/31/2021, 10:07 AM Phone [phone]   Attending Physician Note   I have taken a history, reviewed the chart and examined the patient. I performed a substantive portion of this encounter, including complete performance of at least one of the key components, in conjunction with the APP. I agree with the APP's note, impression and recommendations with my edits. My additional impressions and recommendations are as follows.   SBO presenting with acute abdominal pain, N/V. CT AP shows TI inflammatory changes, dilated SB loops with a transition point at the TI. No flatus or BM today.  R/O infection, Crohn's ileitis, ischemia.  NPO, IVFs and NGT if patient agrees. Consider IV antibiotics, IV corticosteroids if he does not promptly improve. Colonoscopy after acute obstruction resolves as inpatient or outpatient.   Cholelithiasis, asymptomatic.   Claudette Head, MD [facility] See AMION, [facility], for our on call provider

## 2021-12-31 NOTE — ED Notes (Signed)
ED TO INPATIENT HANDOFF REPORT  ED Nurse Name and Phone #: Dessa Phi j  S Name/Age/Gender George Clark 51 y.o. male Room/Bed: 043C/043C  Code Status   Code Status: Full Code  Home/SNF/Other Home Patient oriented to: self, place, time, and situation Is this baseline? Yes   Triage Complete: Triage complete  Chief Complaint Small bowel obstruction ([place]) [P82.423]  Triage Note Patient here with complaint of sharp central abdominal pain that started at 0400 this morning. History of appendectomy, denies any other abdominal surgery. Patient reports nausea and vomiting, denies diarrhea, states he has not had a bowel movement since Saturday morning. Patient is alert, oriented, and unable to find a comfortable position in his chair.   Allergies No Known Allergies  Level of Care/Admitting Diagnosis ED Disposition     ED Disposition  Admit   Condition  --   Comment  Hospital Area: [facility] [[id number]]  Level of Care: Med-Surg [16]  May admit patient to [facility] or [facility] if equivalent level of care is available:: No  Covid Evaluation: Asymptomatic - no recent exposure (last 10 days) testing not required  Diagnosis: Small bowel obstruction [facility]) [[id number]]  Admitting Physician: [place], [place]  Attending Physician: CCS, MD [[id number]]  Certification:: I certify this patient will need inpatient services for at least 2 midnights  Estimated Length of Stay: 4          B Medical/Surgery History Past Medical History:  Diagnosis Date   Gallstones    Dx last year   Past Surgical History:  Procedure Laterality Date   APPENDECTOMY  1998   TREATMENT FISTULA ANAL  2011     A IV Location/Drains/Wounds Patient Lines/Drains/Airways Status     Active Line/Drains/Airways     Name Placement date Placement time Site Days   Peripheral IV 12/30/21 20 G Left Forearm 12/30/21  1653  Forearm  1            Intake/Output Last 24 hours No intake or  output data in the 24 hours ending 12/31/21 1508  Labs/Imaging Results for orders placed or performed during the hospital encounter of 12/30/21 (from the past 48 hour(s))  Lipase, blood     Status: None   Collection Time: 12/30/21  5:04 PM  Result Value Ref Range   Lipase 28 11 - 51 U/L    Comment: Performed at [facility], [address] [place], [place] [address]  Comprehensive metabolic panel     Status: Abnormal   Collection Time: 12/30/21  5:04 PM  Result Value Ref Range   Sodium 135 135 - 145 mmol/L   Potassium 3.6 3.5 - 5.1 mmol/L   Chloride 102 98 - 111 mmol/L   CO2 23 22 - 32 mmol/L   Glucose, Bld 100 (H) 70 - 99 mg/dL    Comment: Glucose reference range applies only to samples taken after fasting for at least 8 hours.   BUN 14 6 - 20 mg/dL   Creatinine, Ser 1.08 0.61 - 1.24 mg/dL   Calcium 8.8 (L) 8.9 - 10.3 mg/dL   Total Protein 7.3 6.5 - 8.1 g/dL   Albumin 4.1 3.5 - 5.0 g/dL   AST 20 15 - 41 U/L   ALT 14 0 - 44 U/L   Alkaline Phosphatase 85 38 - 126 U/L   Total Bilirubin 0.7 0.3 - 1.2 mg/dL   GFR, Estimated >60 >60 mL/min    Comment: (NOTE) Calculated using the CKD-EPI Creatinine Equation (  2021)    Anion gap 10 5 - 15    Comment: Performed at [facility], [address] [place], [place] [address]  CBC     Status: Abnormal   Collection Time: 12/30/21  5:04 PM  Result Value Ref Range   WBC 6.9 4.0 - 10.5 K/uL   RBC 4.38 4.22 - 5.81 MIL/uL   Hemoglobin 13.9 13.0 - 17.0 g/dL   HCT 37.8 58.8 - 50.2 %   MCV 94.7 80.0 - 100.0 fL   MCH 31.7 26.0 - 34.0 pg   MCHC 33.5 30.0 - 36.0 g/dL   RDW 77.4 (L) 12.8 - 78.6 %   Platelets 369 150 - 400 K/uL   nRBC 0.0 0.0 - 0.2 %    Comment: Performed at [facility], [address] [place], [place] [address]  HIV Antibody (routine testing w rflx)     Status: None   Collection Time: 12/31/21  1:10 AM  Result Value Ref Range   HIV Screen 4th Generation wRfx Non Reactive Non Reactive    Comment: Performed  at [facility], [address], [place], [place] [address]  Basic metabolic panel     Status: Abnormal   Collection Time: 12/31/21  1:10 AM  Result Value Ref Range   Sodium 133 (L) 135 - 145 mmol/L   Potassium 3.2 (L) 3.5 - 5.1 mmol/L   Chloride 100 98 - 111 mmol/L   CO2 24 22 - 32 mmol/L   Glucose, Bld 170 (H) 70 - 99 mg/dL    Comment: Glucose reference range applies only to samples taken after fasting for at least 8 hours.   BUN 13 6 - 20 mg/dL   Creatinine, Ser 6.28 0.61 - 1.24 mg/dL   Calcium 8.5 (L) 8.9 - 10.3 mg/dL   GFR, Estimated >36 >62 mL/min    Comment: (NOTE) Calculated using the CKD-EPI Creatinine Equation (2021)    Anion gap 9 5 - 15    Comment: Performed at [facility], [address] [place], [place] [address]  CBC     Status: Abnormal   Collection Time: 12/31/21  1:10 AM  Result Value Ref Range   WBC 7.9 4.0 - 10.5 K/uL   RBC 4.19 (L) 4.22 - 5.81 MIL/uL   Hemoglobin 13.3 13.0 - 17.0 g/dL   HCT 46.5 03.5 - 46.5 %   MCV 94.3 80.0 - 100.0 fL   MCH 31.7 26.0 - 34.0 pg   MCHC 33.7 30.0 - 36.0 g/dL   RDW 68.1 27.5 - 17.0 %   Platelets 333 150 - 400 K/uL   nRBC 0.0 0.0 - 0.2 %    Comment: Performed at [facility], [address], [place], [place] [address]   DG Abd Portable 1V-Small Bowel Obstruction Protocol-initial, 8 hr delay  Result Date: 12/31/2021 CLINICAL DATA:  8 hour delayed image for small-bowel series EXAM: PORTABLE ABDOMEN - 1 VIEW COMPARISON:  CT done on 12/30/2021 FINDINGS: There is moderate dilation of small-bowel loops measuring up to 3.6 cm in diameter. Contrast remains in stomach and dilated small bowel loops. There is contrast in urinary bladder from previous CT. Gas is present in the lumen of colon. Colon is not distended. IMPRESSION: There is dilation of small-bowel loops suggesting ileus or partial obstruction. Electronically Signed   By: Ernie Avena M.D.   On: 12/31/2021 09:13   CT ABDOMEN PELVIS W  CONTRAST  Result Date: 12/30/2021 CLINICAL DATA:  Evaluate for bowel obstruction. EXAM: CT ABDOMEN AND PELVIS WITH CONTRAST TECHNIQUE: Multidetector CT imaging of the abdomen and pelvis was performed using the standard protocol following bolus administration of intravenous contrast. RADIATION DOSE REDUCTION: This exam was performed according to the departmental dose-optimization program which includes automated exposure control, adjustment of the mA and/or kV according to patient size and/or use of iterative reconstruction technique. CONTRAST:  26mL OMNIPAQUE IOHEXOL 350 MG/ML SOLN COMPARISON:  CT abdomen and pelvis 11/30/2014 FINDINGS: Lower chest: No acute abnormality. Hepatobiliary: Gallstones are present. There is no biliary ductal dilatation. The liver is within normal limits. Pancreas: Unremarkable. No pancreatic ductal dilatation or surrounding inflammatory changes. Spleen: Normal in size without focal abnormality. Adrenals/Urinary Tract: Adrenal glands are unremarkable. Kidneys are normal, without renal calculi, focal lesion, or hydronephrosis. Bladder is unremarkable. Stomach/Bowel: There is wall thickening of the terminal ileum. This is transition point for small bowel obstruction. Small bowel loops proximal to this level are mildly dilated with mesenteric edema. Stomach is nondilated. Colon is nondilated. Appendix is surgically absent. No pneumatosis or free air. Vascular/Lymphatic: No significant vascular findings are present. No enlarged abdominal or pelvic lymph nodes. Reproductive: Prostate is unremarkable. Other: Small amount of free fluid in the pelvis. No focal abdominal wall hernia. Musculoskeletal: No acute or significant osseous findings. IMPRESSION: 1. Wall thickening of the terminal ileum worrisome for inflammatory bowel disease. This causes small bowel obstruction with transition point in the right lower quadrant. Mesenteric edema is present which can be seen with vascular compromise. 2.  Small amount of free fluid in the pelvis. 3. Cholelithiasis. Electronically Signed   By: Darliss Cheney M.D.   On: 12/30/2021 18:48    Pending Labs Unresulted Labs (From admission, onward)     Start     Ordered   01/06/22 0500  Creatinine, serum  (enoxaparin (LOVENOX)    CrCl >/= 30 ml/min)  Weekly,   R     Comments: while on enoxaparin therapy    12/30/21 2207   12/31/21 0500  Basic metabolic panel  Daily at 5am,   R      12/30/21 2207   12/31/21 0500  CBC  Daily at 5am,   R      12/30/21 2207   12/30/21 1639  Urinalysis, Routine w reflex microscopic  Once,   URGENT        12/30/21 1638            Vitals/Pain Today's Vitals   12/31/21 1244 12/31/21 1247 12/31/21 1400 12/31/21 1445  BP:   116/75 114/72  Pulse: 61 66 69 66  Resp: 11 10 19 16   Temp:      TempSrc:      SpO2: 96% 96% 96% 96%  PainSc:        Isolation Precautions No active isolations  Medications Medications  oxyCODONE-acetaminophen (PERCOCET/ROXICET) 5-325 MG per tablet 1 tablet (1 tablet Oral Given 12/30/21 1642)  enoxaparin (LOVENOX) injection 40 mg (40 mg Subcutaneous Given 12/31/21 0916)  lactated ringers infusion ( Intravenous New Bag/Given 12/31/21 1136)  acetaminophen (TYLENOL) tablet 650 mg (650 mg Oral Not Given 12/31/21 0916)  ketorolac (TORADOL) 15 MG/ML injection 15 mg (15 mg Intravenous Given 12/31/21 1428)  oxyCODONE (Oxy IR/ROXICODONE) immediate release tablet 5 mg (has no administration in time range)  oxyCODONE (Oxy IR/ROXICODONE) immediate release tablet 10 mg (has no administration in time range)  HYDROmorphone (DILAUDID) injection 0.5 mg (0.5 mg Intravenous Given 12/31/21 1131)  methocarbamol (ROBAXIN) 500 mg in dextrose 5 %  50 mL IVPB (has no administration in time range)  ondansetron (ZOFRAN) injection 4 mg (4 mg Intravenous Given 12/31/21 0104)  prochlorperazine (COMPAZINE) injection 10 mg (10 mg Intravenous Given 12/31/21 0545)  simethicone (MYLICON) chewable tablet 80 mg (has no  administration in time range)  ondansetron (ZOFRAN-ODT) disintegrating tablet 4 mg (4 mg Oral Given 12/30/21 1642)  iohexol (OMNIPAQUE) 350 MG/ML injection 75 mL (75 mLs Intravenous Contrast Given 12/30/21 1838)  diatrizoate meglumine-sodium (GASTROGRAFIN) 66-10 % solution 90 mL (90 mLs Per NG tube Given 12/31/21 0105)    Mobility walks Low fall risk   Focused Assessments    R Recommendations: See Admitting Provider Note  Report given to:   Additional Notes:

## 2021-12-31 NOTE — ED Notes (Signed)
Spoke to patient in depth about need for NG tube and procedure.  Patient hesitant about having tube placed.  Reports improvement with pain medications.  Has additional questions for MD prior to placement.  Would like to wait at this time until further questions are answered.

## 2021-12-31 NOTE — ED Notes (Signed)
Surgery MD paged and made aware of patient's concerns and waiting to wait on NG tube.  Verbal order given to hold tube placement at this time and to have patient drink contrast.  Xray made aware and with complete Xray in 8 hours.  Patient made aware of plan of care and voiced understanding.

## 2021-12-31 NOTE — ED Notes (Signed)
GI at bedside

## 2021-12-31 NOTE — ED Notes (Signed)
Called 2W requesting that purple man be assigned 

## 2021-12-31 NOTE — ED Notes (Signed)
X-ray at bedside

## 2022-01-01 ENCOUNTER — Inpatient Hospital Stay (HOSPITAL_COMMUNITY): Payer: BC Managed Care – PPO

## 2022-01-01 LAB — CBC
HCT: 36.6 % — ABNORMAL LOW (ref 39.0–52.0)
Hemoglobin: 12.4 g/dL — ABNORMAL LOW (ref 13.0–17.0)
MCH: 32.4 pg (ref 26.0–34.0)
MCHC: 33.9 g/dL (ref 30.0–36.0)
MCV: 95.6 fL (ref 80.0–100.0)
Platelets: 281 10*3/uL (ref 150–400)
RBC: 3.83 MIL/uL — ABNORMAL LOW (ref 4.22–5.81)
RDW: 11.5 % (ref 11.5–15.5)
WBC: 8.9 10*3/uL (ref 4.0–10.5)
nRBC: 0 % (ref 0.0–0.2)

## 2022-01-01 LAB — BASIC METABOLIC PANEL
Anion gap: 9 (ref 5–15)
BUN: 16 mg/dL (ref 6–20)
CO2: 27 mmol/L (ref 22–32)
Calcium: 8.5 mg/dL — ABNORMAL LOW (ref 8.9–10.3)
Chloride: 102 mmol/L (ref 98–111)
Creatinine, Ser: 1.14 mg/dL (ref 0.61–1.24)
GFR, Estimated: >60 mL/min (ref >60)
Glucose, Bld: 97 mg/dL (ref 70–99)
Potassium: 3.6 mmol/L (ref 3.5–5.1)
Sodium: 138 mmol/L (ref 135–145)

## 2022-01-01 MED ORDER — METOCLOPRAMIDE HCL 5 MG/ML IJ SOLN
10.0000 mg | Freq: Once | INTRAMUSCULAR | Status: AC
  Administered 2022-01-02: 10 mg via INTRAVENOUS
  Filled 2022-01-01: qty 2

## 2022-01-01 MED ORDER — METOCLOPRAMIDE HCL 5 MG/ML IJ SOLN
10.0000 mg | Freq: Once | INTRAMUSCULAR | Status: AC
  Administered 2022-01-01: 10 mg via INTRAVENOUS
  Filled 2022-01-01: qty 2

## 2022-01-01 MED ORDER — PEG-KCL-NACL-NASULF-NA ASC-C 100 G PO SOLR
0.5000 | Freq: Once | ORAL | Status: DC
  Filled 2022-01-01: qty 1

## 2022-01-01 MED ORDER — PEG-KCL-NACL-NASULF-NA ASC-C 100 G PO SOLR: 1.0000 | Freq: Once | ORAL | Status: DC

## 2022-01-01 NOTE — Progress Notes (Signed)
[facility] Progress Note     Subjective: CC-  Feeling much better today. Abdominal pain nearly resolved. Denies n/v. Passing flatus and has had several loose stools. Xray today shows contrast in colon with some persistently dilated loops of small bowel.  Objective: Vital signs in last 24 hours: Temp:  [97.8 F (36.6 C)-99.1 F (37.3 C)] 98 F (36.7 C) (10/17 0700) Pulse Rate:  [60-78] 60 (10/17 0700) Resp:  [10-21] 16 (10/17 0700) BP: (105-123)/(61-76) 119/69 (10/17 0700) SpO2:  [96 %-100 %] 100 % (10/17 0700) Weight:  [59.6 kg] 59.6 kg (10/16 1648) Last BM Date : 01/01/22  Intake/Output from previous day: 10/16 0701 - 10/17 0700 In: 1988.8 [I.V.:1988.8] Out: -  Intake/Output this shift: No intake/output data recorded.  PE: Gen:  Alert, NAD, pleasant Abd: soft, ND, minimal subjective RLQ TTP without rebound or guarding  Lab Results:  Recent Labs    12/31/21 0110 01/01/22 0421  WBC 7.9 8.9  HGB 13.3 12.4*  HCT 39.5 36.6*  PLT 333 281   BMET Recent Labs    12/31/21 0110 01/01/22 0421  NA 133* 138  K 3.2* 3.6  CL 100 102  CO2 24 27  GLUCOSE 170* 97  BUN 13 16  CREATININE 1.09 1.14  CALCIUM 8.5* 8.5*   PT/INR No results for input(s): "LABPROT", "INR" in the last 72 hours. CMP     Component Value Date/Time   NA 138 01/01/2022 0421   K 3.6 01/01/2022 0421   CL 102 01/01/2022 0421   CO2 27 01/01/2022 0421   GLUCOSE 97 01/01/2022 0421   BUN 16 01/01/2022 0421   CREATININE 1.14 01/01/2022 0421   CREATININE 1.08 11/01/2014 0001   CALCIUM 8.5 (L) 01/01/2022 0421   PROT 7.3 12/30/2021 1704   ALBUMIN 4.1 12/30/2021 1704   AST 20 12/30/2021 1704   ALT 14 12/30/2021 1704   ALKPHOS 85 12/30/2021 1704   BILITOT 0.7 12/30/2021 1704   GFRNONAA >60 01/01/2022 0421   GFRAA >60 12/28/2015 0931   Lipase     Component Value Date/Time   LIPASE 28 12/30/2021 1704       Studies/Results: DG Abd Portable 1V-Small Bowel Obstruction Protocol-24  hr delay  Result Date: 01/01/2022 CLINICAL DATA:  NG tube placement EXAM: PORTABLE ABDOMEN - 1 VIEW COMPARISON:  12/31/2021 FINDINGS: Oral contrast material seen within the colon. Dilated central small bowel loops. No visible NG tube. IMPRESSION: Small bowel obstruction pattern. No visible NG tube. Electronically Signed   By: Rolm Baptise M.D.   On: 01/01/2022 02:09   DG Abd Portable 1V-Small Bowel Obstruction Protocol-initial, 8 hr delay  Result Date: 12/31/2021 CLINICAL DATA:  8 hour delayed image for small-bowel series EXAM: PORTABLE ABDOMEN - 1 VIEW COMPARISON:  CT done on 12/30/2021 FINDINGS: There is moderate dilation of small-bowel loops measuring up to 3.6 cm in diameter. Contrast remains in stomach and dilated small bowel loops. There is contrast in urinary bladder from previous CT. Gas is present in the lumen of colon. Colon is not distended. IMPRESSION: There is dilation of small-bowel loops suggesting ileus or partial obstruction. Electronically Signed   By: Elmer Picker M.D.   On: 12/31/2021 09:13   CT ABDOMEN PELVIS W CONTRAST  Result Date: 12/30/2021 CLINICAL DATA:  Evaluate for bowel obstruction. EXAM: CT ABDOMEN AND PELVIS WITH CONTRAST TECHNIQUE: Multidetector CT imaging of the abdomen and pelvis was performed using the standard protocol following bolus administration of intravenous contrast. RADIATION DOSE REDUCTION: This exam was performed  according to the departmental dose-optimization program which includes automated exposure control, adjustment of the mA and/or kV according to patient size and/or use of iterative reconstruction technique. CONTRAST:  64mL OMNIPAQUE IOHEXOL 350 MG/ML SOLN COMPARISON:  CT abdomen and pelvis 11/30/2014 FINDINGS: Lower chest: No acute abnormality. Hepatobiliary: Gallstones are present. There is no biliary ductal dilatation. The liver is within normal limits. Pancreas: Unremarkable. No pancreatic ductal dilatation or surrounding inflammatory  changes. Spleen: Normal in size without focal abnormality. Adrenals/Urinary Tract: Adrenal glands are unremarkable. Kidneys are normal, without renal calculi, focal lesion, or hydronephrosis. Bladder is unremarkable. Stomach/Bowel: There is wall thickening of the terminal ileum. This is transition point for small bowel obstruction. Small bowel loops proximal to this level are mildly dilated with mesenteric edema. Stomach is nondilated. Colon is nondilated. Appendix is surgically absent. No pneumatosis or free air. Vascular/Lymphatic: No significant vascular findings are present. No enlarged abdominal or pelvic lymph nodes. Reproductive: Prostate is unremarkable. Other: Small amount of free fluid in the pelvis. No focal abdominal wall hernia. Musculoskeletal: No acute or significant osseous findings. IMPRESSION: 1. Wall thickening of the terminal ileum worrisome for inflammatory bowel disease. This causes small bowel obstruction with transition point in the right lower quadrant. Mesenteric edema is present which can be seen with vascular compromise. 2. Small amount of free fluid in the pelvis. 3. Cholelithiasis. Electronically Signed   By: Darliss Cheney M.D.   On: 12/30/2021 18:48    Anti-infectives: Anti-infectives (From admission, onward)    None        Assessment/Plan SBO, crohns vs adhesive -CT scan 10/15 shows SBO with transition point in the RLQ, worrisome for crohn's disease. GI has seen with recs "R/O infection, Crohn's ileitis, ischemia.  NPO, IVFs and NGT if patient agrees. Consider IV antibiotics, IV corticosteroids if he does not promptly improve. Colonoscopy after acute obstruction resolves as inpatient or outpatient." - Clinically improved with return in bowel function and abdominal symptoms nearly resolved. Will start on clear liquid diet. Will reach out to GI regarding further inpatient vs outpatient work up from their standpoint. If he is not going to have a colonoscopy this admission  will advance diet as tolerated.  FEN - IVF, CLD VTE - lovenox ID - none currently needed   Anal fistula  I reviewed Consultant gastroenterology notes, last 24 h vitals and pain scores, last 48 h intake and output, last 24 h labs and trends, and last 24 h imaging results.    LOS: 2 days    Franne Forts, [facility] 01/01/2022, 10:19 AM Please see Amion for pager number during day hours 7:00am-4:30pm

## 2022-01-01 NOTE — Progress Notes (Addendum)
Daily Rounding Note  01/01/2022, 10:37 AM  LOS: 2 days   SUBJECTIVE:   Chief complaint:    SBO/PSBO.  ? Crohn's of ileum  Feels a whole lot better today.  Abdominal pain has almost completely resolved.  Has not had nausea or vomiting.  Passed several loose stools as well as flatus.  Then had a KUB.  After the KUB he had another series of loose stools and continues to pass flatus.  Has not required any pain meds since before midnight.  No nausea.  OBJECTIVE:         Vital signs in last 24 hours:    Temp:  [97.8 F (36.6 C)-99.1 F (37.3 C)] 98 F (36.7 C) (10/17 0700) Pulse Rate:  [60-78] 60 (10/17 0700) Resp:  [10-21] 16 (10/17 0700) BP: (105-123)/(61-76) 119/69 (10/17 0700) SpO2:  [96 %-100 %] 100 % (10/17 0700) Weight:  [59.6 kg] 59.6 kg (10/16 1648) Last BM Date : 01/01/22 Filed Weights   12/31/21 1648  Weight: 59.6 kg   General: Looks better.  Does not look ill.  Comfortable, alert. Heart: RRR. Chest: Clear bilaterally without labored breathing. Abdomen: Soft.  Not distended.  Minor right lower quadrant tenderness but no guarding or rebound.  Bowel sounds active. Extremities: No CCE. Neuro/Psych: Calm, pleasant, cooperative.  Fully alert and oriented.  No gross deficits.  Intake/Output from previous day: 10/16 0701 - 10/17 0700 In: 1988.8 [I.V.:1988.8] Out: -   Intake/Output this shift: No intake/output data recorded.  Lab Results: Recent Labs    12/30/21 1704 12/31/21 0110 01/01/22 0421  WBC 6.9 7.9 8.9  HGB 13.9 13.3 12.4*  HCT 41.5 39.5 36.6*  PLT 369 333 281   BMET Recent Labs    12/30/21 1704 12/31/21 0110 01/01/22 0421  NA 135 133* 138  K 3.6 3.2* 3.6  CL 102 100 102  CO2 23 24 27   GLUCOSE 100* 170* 97  BUN 14 13 16   CREATININE 1.08 1.09 1.14  CALCIUM 8.8* 8.5* 8.5*   LFT Recent Labs    12/30/21 1704  PROT 7.3  ALBUMIN 4.1  AST 20  ALT 14  ALKPHOS 85  BILITOT 0.7     Studies/Results: DG Abd Portable 1V-Small Bowel Obstruction Protocol-24 hr delay  Result Date: 01/01/2022 CLINICAL DATA:  NG tube placement EXAM: PORTABLE ABDOMEN - 1 VIEW COMPARISON:  12/31/2021 FINDINGS: Oral contrast material seen within the colon. Dilated central small bowel loops. No visible NG tube. IMPRESSION: Small bowel obstruction pattern. No visible NG tube. Electronically Signed   By: Rolm Baptise M.D.   On: 01/01/2022 02:09   DG Abd Portable 1V-Small Bowel Obstruction Protocol-initial, 8 hr delay  Result Date: 12/31/2021 CLINICAL DATA:  8 hour delayed image for small-bowel series EXAM: PORTABLE ABDOMEN - 1 VIEW COMPARISON:  CT done on 12/30/2021 FINDINGS: There is moderate dilation of small-bowel loops measuring up to 3.6 cm in diameter. Contrast remains in stomach and dilated small bowel loops. There is contrast in urinary bladder from previous CT. Gas is present in the lumen of colon. Colon is not distended. IMPRESSION: There is dilation of small-bowel loops suggesting ileus or partial obstruction. Electronically Signed   By: Elmer Picker M.D.   On: 12/31/2021 09:13   CT ABDOMEN PELVIS W CONTRAST  Result Date: 12/30/2021 CLINICAL DATA:  Evaluate for bowel obstruction. EXAM: CT ABDOMEN AND PELVIS WITH CONTRAST TECHNIQUE: Multidetector CT imaging of the abdomen and pelvis was performed using the  standard protocol following bolus administration of intravenous contrast. RADIATION DOSE REDUCTION: This exam was performed according to the departmental dose-optimization program which includes automated exposure control, adjustment of the mA and/or kV according to patient size and/or use of iterative reconstruction technique. CONTRAST:  77mL OMNIPAQUE IOHEXOL 350 MG/ML SOLN COMPARISON:  CT abdomen and pelvis 11/30/2014 FINDINGS: Lower chest: No acute abnormality. Hepatobiliary: Gallstones are present. There is no biliary ductal dilatation. The liver is within normal limits. Pancreas:  Unremarkable. No pancreatic ductal dilatation or surrounding inflammatory changes. Spleen: Normal in size without focal abnormality. Adrenals/Urinary Tract: Adrenal glands are unremarkable. Kidneys are normal, without renal calculi, focal lesion, or hydronephrosis. Bladder is unremarkable. Stomach/Bowel: There is wall thickening of the terminal ileum. This is transition point for small bowel obstruction. Small bowel loops proximal to this level are mildly dilated with mesenteric edema. Stomach is nondilated. Colon is nondilated. Appendix is surgically absent. No pneumatosis or free air. Vascular/Lymphatic: No significant vascular findings are present. No enlarged abdominal or pelvic lymph nodes. Reproductive: Prostate is unremarkable. Other: Small amount of free fluid in the pelvis. No focal abdominal wall hernia. Musculoskeletal: No acute or significant osseous findings. IMPRESSION: 1. Wall thickening of the terminal ileum worrisome for inflammatory bowel disease. This causes small bowel obstruction with transition point in the right lower quadrant. Mesenteric edema is present which can be seen with vascular compromise. 2. Small amount of free fluid in the pelvis. 3. Cholelithiasis. Electronically Signed   By: Darliss Cheney M.D.   On: 12/30/2021 18:48    ASSESMENT:    SBO/PSBO.  Clinically improved.  However KUB this morning shows oral contrast has reached into the colon but there are persistent dilated central small bowel loops.  Wall thickening at the terminal ileum.  Remote history of anal fistula 2011.  Concerning for IBD.  Hyponatremia, resolved.   PLAN   Clear liquid diet.   Arrange for colonoscopy tomorrow at 1445  See prep orders.   Jennye Moccasin  01/01/2022, 10:37 AM Phone [phone]    Attending Physician Note   I have taken an interval history, reviewed the chart and examined the patient. I performed a substantive portion of this encounter, including complete performance of at  least one of the key components, in conjunction with the APP. I agree with the APP's note, impression and recommendations with my edits. My additional impressions and recommendations are as follows.   TI wall thickening and partial SBO which is clinically improved however persistent dilated SB loops on KUB. Schedule colonoscopy for tomorrow. If he completes bowel prep without vomiting/pain and it is effective will proceed with colonoscopy.   Claudette Head, MD [facility] See AMION, [facility], for our on call provider

## 2022-01-01 NOTE — TOC Initial Note (Signed)
Transition of Care [facility]) - Initial/Assessment Note    Patient Details  Name: George Clark MRN: [id number] Date of Birth: 12/19/1970  Transition of Care [facility]) CM/SW Contact:    Curlene Labrum, RN Phone Number: 01/01/2022, 11:25 AM  Clinical Narrative:                  Transition of Care [facility]) Screening Note   Patient Details  Name: George Clark Date of Birth: [birth year]   Transition of Care [facility]) CM/SW Contact:    Curlene Labrum, RN Phone Number: 01/01/2022, 11:26 AM    Transition of Care Department [facility]) has reviewed patient and no TOC needs have been identified at this time. We will continue to monitor patient advancement through interdisciplinary progression rounds. If new patient transition needs arise, please place a TOC consult.  I met with the patient at the bedside and offered to assist with PCP follow up but the patient states that he plans to follow up with his wife's PCP and will coordinate appointment on his own.  The patient has pending plans for possible colonoscopy/ GI follow up and may be discharged today or tomorrow.  The patient's wife plans to provide transportation to home by car in the next 1-2 days.    Expected Discharge Plan: Home/Self Care Barriers to Discharge: Continued Medical Work up   Patient Goals and CMS Choice Patient states their goals for this hospitalization and ongoing recovery are:: To get better and return home. CMS Medicare.gov Compare Post Acute Care list provided to:: Patient    Expected Discharge Plan and Services Expected Discharge Plan: Home/Self Care       Living arrangements for the past 2 months: Single Family Home                                      Prior Living Arrangements/Services Living arrangements for the past 2 months: Single Family Home Lives with:: Spouse Patient language and need for interpreter reviewed:: Yes Do you feel safe going back to the place where you live?: Yes       Need for Family Participation in Patient Care: Yes (Comment)     Criminal Activity/Legal Involvement Pertinent to Current Situation/Hospitalization: No - Comment as needed  Activities of Daily Living Home Assistive Devices/Equipment: None ADL Screening (condition at time of admission) Patient's cognitive ability adequate to safely complete daily activities?: Yes Is the patient deaf or have difficulty hearing?: No Does the patient have difficulty seeing, even when wearing glasses/contacts?: No Does the patient have difficulty concentrating, remembering, or making decisions?: No Patient able to express need for assistance with ADLs?: No Does the patient have difficulty dressing or bathing?: No Independently performs ADLs?: Yes (appropriate for developmental age) Does the patient have difficulty walking or climbing stairs?: No Weakness of Legs: None Weakness of Arms/Hands: None  Permission Sought/Granted Permission sought to share information with : Case Manager Permission granted to share information with : Yes, Verbal Permission Granted        Permission granted to share info w Relationship: wife     Emotional Assessment Appearance:: Appears stated age Attitude/Demeanor/Rapport: Gracious Affect (typically observed): Accepting Orientation: : Oriented to Self, Oriented to Place, Oriented to  Time, Oriented to Situation Alcohol / Substance Use: Tobacco Use Psych Involvement: No (comment)  Admission diagnosis:  Small bowel obstruction ([place]) [K56.609] Patient Active Problem List   Diagnosis Date Noted  Small bowel obstruction ([place]) 12/30/2021   Abdominal pain 11/01/2014   DIARRHEA 08/23/2009   PCP:  Pcp, No Pharmacy:   [id number] - , [address] [place] [address] Phone: [phone] Fax: [phone]     Social Determinants of Health (SDOH) Interventions     Readmission Risk Interventions    01/01/2022   11:25 AM  Readmission Risk Prevention Plan  Post Dischage Appt Complete  Medication Screening Complete  Transportation Screening Complete

## 2022-01-02 ENCOUNTER — Other Ambulatory Visit: Payer: Self-pay

## 2022-01-02 ENCOUNTER — Inpatient Hospital Stay (HOSPITAL_COMMUNITY): Payer: BC Managed Care – PPO

## 2022-01-02 ENCOUNTER — Encounter (HOSPITAL_COMMUNITY): Payer: Self-pay | Admitting: Certified Registered"

## 2022-01-02 ENCOUNTER — Encounter (HOSPITAL_COMMUNITY): Admission: EM | Disposition: A | Discharge status: Home / Self Care | Payer: Self-pay | Source: Home / Self Care

## 2022-01-02 DIAGNOSIS — K4090 Unilateral inguinal hernia, without obstruction or gangrene, not specified as recurrent: Secondary | ICD-10-CM

## 2022-01-02 LAB — BASIC METABOLIC PANEL
Anion gap: 10 (ref 5–15)
BUN: 13 mg/dL (ref 6–20)
CO2: 25 mmol/L (ref 22–32)
Calcium: 8.5 mg/dL — ABNORMAL LOW (ref 8.9–10.3)
Chloride: 103 mmol/L (ref 98–111)
Creatinine, Ser: 1.19 mg/dL (ref 0.61–1.24)
GFR, Estimated: >60 mL/min (ref >60)
Glucose, Bld: 106 mg/dL — ABNORMAL HIGH (ref 70–99)
Potassium: 3.7 mmol/L (ref 3.5–5.1)
Sodium: 138 mmol/L (ref 135–145)

## 2022-01-02 LAB — CBC
HCT: 40 % (ref 39.0–52.0)
Hemoglobin: 13.4 g/dL (ref 13.0–17.0)
MCH: 31.6 pg (ref 26.0–34.0)
MCHC: 33.5 g/dL (ref 30.0–36.0)
MCV: 94.3 fL (ref 80.0–100.0)
Platelets: 285 10*3/uL (ref 150–400)
RBC: 4.24 MIL/uL (ref 4.22–5.81)
RDW: 11.4 % — ABNORMAL LOW (ref 11.5–15.5)
WBC: 9.2 10*3/uL (ref 4.0–10.5)
nRBC: 0 % (ref 0.0–0.2)

## 2022-01-02 SURGERY — CANCELLED PROCEDURE

## 2022-01-02 MED ORDER — METHYLPREDNISOLONE SODIUM SUCC 125 MG IJ SOLR: 60.0000 mg | Freq: Every day | INTRAMUSCULAR | Status: DC

## 2022-01-02 MED ORDER — METHYLPREDNISOLONE SODIUM SUCC 125 MG IJ SOLR
60.0000 mg | Freq: Every day | INTRAMUSCULAR | Status: DC
  Administered 2022-01-02 – 2022-01-04 (×3): 60 mg via INTRAVENOUS
  Filled 2022-01-02 (×4): qty 2

## 2022-01-02 SURGICAL SUPPLY — 14 items

## 2022-01-02 NOTE — Progress Notes (Addendum)
Daily Rounding Note  01/02/2022, 8:58 AM  LOS: 3 days   SUBJECTIVE:   Chief complaint:   Abdominal pain.  Ileal inflammation.  SBO/PSBO  Late yesterday afternoon/early evening and throughout the night had episodes of intense mid abdominal pain.  Not accompanied with nausea or vomiting.  Pain actually somewhat refractory to Dilaudid but Percocet helped though the pain persists.  Unable to complete prep for colonoscopy. Patient noted a right groin bulge in the shower yesterday.  OBJECTIVE:         Vital signs in last 24 hours:    Temp:  [97.9 F (36.6 C)-98.5 F (36.9 C)] 98.5 F (36.9 C) (10/18 0731) Pulse Rate:  [70-87] 74 (10/18 0731) Resp:  [15-18] 18 (10/18 0731) BP: (103-130)/(65-73) 110/65 (10/18 0731) SpO2:  [95 %-100 %] 96 % (10/18 0731) Last BM Date : 01/01/22 Filed Weights   12/31/21 1648  Weight: 59.6 kg   General: NAD.  Currently comfortable.  Does not look ill. Heart: RRR. Chest: Clear without labored breathing Abdomen: Soft without distention.  Bowel sounds normal but somewhat hypoactive.  Tender without guarding or rebound, nonfocal but generally in the mid abdomen.  Right inguinal hernia, partially reduced with manual pressure. Extremities: No CCE. Neuro/Psych: Alert.  Appropriate.  Fluid speech.  Oriented x3.  No gross deficits  Intake/Output from previous day: No intake/output data recorded.  Intake/Output this shift: No intake/output data recorded.  Lab Results: Recent Labs    12/31/21 0110 01/01/22 0421 01/02/22 0429  WBC 7.9 8.9 9.2  HGB 13.3 12.4* 13.4  HCT 39.5 36.6* 40.0  PLT 333 281 285   BMET Recent Labs    12/31/21 0110 01/01/22 0421 01/02/22 0429  NA 133* 138 138  K 3.2* 3.6 3.7  CL 100 102 103  CO2 24 27 25   GLUCOSE 170* 97 106*  BUN 13 16 13   CREATININE 1.09 1.14 1.19  CALCIUM 8.5* 8.5* 8.5*   LFT Recent Labs    12/30/21 1704  PROT 7.3  ALBUMIN 4.1  AST 20   ALT 14  ALKPHOS 85  BILITOT 0.7   PT/INR No results for input(s): "LABPROT", "INR" in the last 72 hours. Hepatitis Panel No results for input(s): "HEPBSAG", "HCVAB", "HEPAIGM", "HEPBIGM" in the last 72 hours.  Studies/Results: DG Abd Portable 1V-Small Bowel Obstruction Protocol-24 hr delay  Result Date: 01/01/2022 CLINICAL DATA:  NG tube placement EXAM: PORTABLE ABDOMEN - 1 VIEW COMPARISON:  12/31/2021 FINDINGS: Oral contrast material seen within the colon. Dilated central small bowel loops. No visible NG tube. IMPRESSION: Small bowel obstruction pattern. No visible NG tube. Electronically Signed   By: Rolm Baptise M.D.   On: 01/01/2022 02:09   DG Abd Portable 1V-Small Bowel Obstruction Protocol-initial, 8 hr delay  Result Date: 12/31/2021 CLINICAL DATA:  8 hour delayed image for small-bowel series EXAM: PORTABLE ABDOMEN - 1 VIEW COMPARISON:  CT done on 12/30/2021 FINDINGS: There is moderate dilation of small-bowel loops measuring up to 3.6 cm in diameter. Contrast remains in stomach and dilated small bowel loops. There is contrast in urinary bladder from previous CT. Gas is present in the lumen of colon. Colon is not distended. IMPRESSION: There is dilation of small-bowel loops suggesting ileus or partial obstruction. Electronically Signed   By: Elmer Picker M.D.   On: 12/31/2021 09:13    ASSESMENT:   PSBO/SBO.  Clinically had improved this time yesterday but now with recurrent intense abdominal pain.  New issue  today is the right inguinal hernia.  Ileal inflammation.  Suspicious for Crohn's disease.   PLAN   Planned colonoscopy canceled.  Surgery to assess the hernia.  Possibly add Solu-Medrol but hold off on this in case patient needs surgery.  George Clark  01/02/2022, 8:58 AM Phone [phone]     Attending Physician Note   I have taken an interval history, reviewed the chart and examined the patient. I performed a substantive portion of this encounter, including  complete performance of at least one of the key components, in conjunction with the APP. I agree with the APP's note, impression and recommendations with my edits. My additional impressions and recommendations are as follows.   Worsening SBO. Colonoscopy cancelled. Consider Solu-Medrol for possible Crohn's ileitis however his acute onset of symptoms is atypical. Holding off for now given worsening obstruction and right inguinal hernia.    Right inguinal hernia, partially reduced. Potential cause of SBO. Surgery to evaluate.   Claudette Head, MD [facility] See AMION, [facility], for our on call provider

## 2022-01-02 NOTE — Progress Notes (Signed)
Mobility Specialist Progress Note:   01/02/22 1031  Mobility  Activity Ambulated independently in room  Level of Assistance Independent  Assistive Device None  Distance Ambulated (ft) 40 ft  Activity Response Tolerated well  Mobility Referral No  $Mobility charge 1 Mobility   Pt received in bed and agreeable. Declined further mobility d/t independence walking in room. No complaints. Pt left in bed with all needs met and call bell in reach.    [place] Mobility Specialist-Acute Rehab Secure Chat only

## 2022-01-02 NOTE — Progress Notes (Signed)
[facility] Progress Note  Day of Surgery  Subjective: CC-  Worsening abdominal pain around 2030 last night. Pain is RLQ. Denies any nausea or vomiting. He is tolerating clear liquids. Continues to pass flatus and has had 2 more loose stools already this morning.  He also reports noticing a bulge in his right groin. States that it has been intermittent over the last several days. It is currently present but not causing pain. Denies scrotal pain. No issues with urination.  Objective: Vital signs in last 24 hours: Temp:  [97.9 F (36.6 C)-98.5 F (36.9 C)] 98.5 F (36.9 C) (10/18 0731) Pulse Rate:  [70-87] 74 (10/18 0731) Resp:  [15-18] 18 (10/18 0731) BP: (103-130)/(65-73) 110/65 (10/18 0731) SpO2:  [95 %-100 %] 96 % (10/18 0731) Last BM Date : 01/01/22  Intake/Output from previous day: No intake/output data recorded. Intake/Output this shift: No intake/output data recorded.  PE: Gen:  Alert, NAD, pleasant Abd: soft, nondistended, mild generalized tenderness with more moderate RLQ tenderness, no rebound or guarding. Bulge in right groin is soft and nontender, no overlying skin changes  Lab Results:  Recent Labs    01/01/22 0421 01/02/22 0429  WBC 8.9 9.2  HGB 12.4* 13.4  HCT 36.6* 40.0  PLT 281 285   BMET Recent Labs    01/01/22 0421 01/02/22 0429  NA 138 138  K 3.6 3.7  CL 102 103  CO2 27 25  GLUCOSE 97 106*  BUN 16 13  CREATININE 1.14 1.19  CALCIUM 8.5* 8.5*   PT/INR No results for input(s): "LABPROT", "INR" in the last 72 hours. CMP     Component Value Date/Time   NA 138 01/02/2022 0429   K 3.7 01/02/2022 0429   CL 103 01/02/2022 0429   CO2 25 01/02/2022 0429   GLUCOSE 106 (H) 01/02/2022 0429   BUN 13 01/02/2022 0429   CREATININE 1.19 01/02/2022 0429   CREATININE 1.08 11/01/2014 0001   CALCIUM 8.5 (L) 01/02/2022 0429   PROT 7.3 12/30/2021 1704   ALBUMIN 4.1 12/30/2021 1704   AST 20 12/30/2021 1704   ALT 14 12/30/2021 1704    ALKPHOS 85 12/30/2021 1704   BILITOT 0.7 12/30/2021 1704   GFRNONAA >60 01/02/2022 0429   GFRAA >60 12/28/2015 0931   Lipase     Component Value Date/Time   LIPASE 28 12/30/2021 1704       Studies/Results: DG Abd Portable 1V-Small Bowel Obstruction Protocol-24 hr delay  Result Date: 01/01/2022 CLINICAL DATA:  NG tube placement EXAM: PORTABLE ABDOMEN - 1 VIEW COMPARISON:  12/31/2021 FINDINGS: Oral contrast material seen within the colon. Dilated central small bowel loops. No visible NG tube. IMPRESSION: Small bowel obstruction pattern. No visible NG tube. Electronically Signed   By: Charlett Nose M.D.   On: 01/01/2022 02:09    Anti-infectives: Anti-infectives (From admission, onward)    None        Assessment/Plan SBO, crohns vs adhesive - CT scan 10/15 shows SBO with transition point in the RLQ, worrisome for crohn's disease.  - Worsening pain this morning but continues to have bowel function and no n/v. Will repeat abdominal film this morning. Can hold on NG unless he develops n/v. Discussed with GI, they are cancelling colonoscopy today and starting IV steroids for possible Crohns/IBD.    FEN - IVF, CLD VTE - lovenox ID - none currently needed   Anal fistula   I reviewed Consultant gastroenterology notes, last 24 h vitals and pain scores, last 48 h  intake and output, last 24 h labs and trends, and last 24 h imaging results.    LOS: 3 days    [facility] 01/02/2022, 9:49 AM Please see Amion for pager number during day hours 7:00am-4:30pm

## 2022-01-03 LAB — CBC
HCT: 36.7 % — ABNORMAL LOW (ref 39.0–52.0)
Hemoglobin: 12.5 g/dL — ABNORMAL LOW (ref 13.0–17.0)
MCH: 31.6 pg (ref 26.0–34.0)
MCHC: 34.1 g/dL (ref 30.0–36.0)
MCV: 92.9 fL (ref 80.0–100.0)
Platelets: 283 10*3/uL (ref 150–400)
RBC: 3.95 MIL/uL — ABNORMAL LOW (ref 4.22–5.81)
RDW: 11.1 % — ABNORMAL LOW (ref 11.5–15.5)
WBC: 9.3 10*3/uL (ref 4.0–10.5)
nRBC: 0 % (ref 0.0–0.2)

## 2022-01-03 LAB — BASIC METABOLIC PANEL
Anion gap: 7 (ref 5–15)
BUN: 19 mg/dL (ref 6–20)
CO2: 27 mmol/L (ref 22–32)
Calcium: 8.6 mg/dL — ABNORMAL LOW (ref 8.9–10.3)
Chloride: 101 mmol/L (ref 98–111)
Creatinine, Ser: 1.08 mg/dL (ref 0.61–1.24)
GFR, Estimated: >60 mL/min (ref >60)
Glucose, Bld: 131 mg/dL — ABNORMAL HIGH (ref 70–99)
Potassium: 4.2 mmol/L (ref 3.5–5.1)
Sodium: 135 mmol/L (ref 135–145)

## 2022-01-03 MED ORDER — POLYETHYLENE GLYCOL 3350 17 GM/SCOOP PO POWD
1.0000 | Freq: Once | ORAL | Status: AC
  Administered 2022-01-03: 255 g via ORAL
  Filled 2022-01-03: qty 255

## 2022-01-03 NOTE — Progress Notes (Addendum)
     [facility] Progress Note  CC:  Abdominal pain.  Ileal inflammation.  SBO/PSBO  Subjective:  Feeling much better today.  Passing flatus and liquid stool.  No significant pain.  Objective:  Vital signs in last 24 hours: Temp:  [97.8 F (36.6 C)-98 F (36.7 C)] 98 F (36.7 C) (10/19 0403) Pulse Rate:  [68-87] 68 (10/19 0403) Resp:  [17-18] 17 (10/19 0403) BP: (104-111)/(65-73) 104/65 (10/19 0403) SpO2:  [97 %-99 %] 97 % (10/19 0403) Last BM Date : 01/01/22 General:  Alert, Well-developed, in NAD Heart:  Regular rate and rhythm; no murmurs Pulm:  CTAB.  No W/R/R. Abdomen:  Soft, non-distended.  BS present.  Minimal TTP.  Right inguinal hernia noted, but reducible.   Extremities:  Without edema. Neurologic:  Alert and oriented x 4;  grossly normal neurologically. Psych:  Alert and cooperative. Normal mood and affect.  Lab Results: Recent Labs    01/01/22 0421 01/02/22 0429 01/03/22 0253  WBC 8.9 9.2 9.3  HGB 12.4* 13.4 12.5*  HCT 36.6* 40.0 36.7*  PLT 281 285 283   BMET Recent Labs    01/01/22 0421 01/02/22 0429 01/03/22 0253  NA 138 138 135  K 3.6 3.7 4.2  CL 102 103 101  CO2 27 25 27  GLUCOSE 97 106* 131*  BUN 16 13 19  CREATININE 1.14 1.19 1.08  CALCIUM 8.5* 8.5* 8.6*   DG Abd 1 View  Result Date: 01/02/2022 CLINICAL DATA:  Bowel obstruction EXAM: ABDOMEN - 1 VIEW COMPARISON:  01/01/2022 FINDINGS: Most of the contrast previously seen within the colon has been evacuated with only a small amount remaining in the distal descending colon, sigmoid colon and rectum. There is a persistent small bowel obstruction pattern with dilated fluid and air-filled small intestine. Maximal diameter measured at 4.5 cm. IMPRESSION: Most of the contrast previously seen within the colon has been evacuated with only a small amount remaining in the distal descending colon, sigmoid colon and rectum. Persistent small bowel obstruction pattern. Electronically Signed    By: Mark  Shogry M.D.   On: 01/02/2022 11:36    Assessment / Plan: PSBO/SBO.  Ileal inflammation, ? Acute infections vs. Crohn's disease.  Also has a new right inguinal hernia.  Surgery has seen the patient; no plan for surgery at least during this hospital stay.  -Continue solumedrol 60 mg daily for now. -He wants to try to proceed with colonoscopy on 10/20.  Will use Miralax/Gatorade bowel prep.  Continue clear liquids for now and then NPO after midnight except for bowel prep.    LOS: 4 days   Jessica D. Zehr  01/03/2022, 11:01 AM     Attending Physician Note   I have taken an interval history, reviewed the chart and examined the patient. I performed a substantive portion of this encounter, including complete performance of at least one of the key components, in conjunction with the APP. I agree with the APP's note, impression and recommendations with my edits. My additional impressions and recommendations are as follows.   PSBO improved today. TI thickening - R/O Crohn's, infection, other. Right inguinal hernia.  Day 2 Solu-Medrol 60 mg IV. He wants proceed with colonoscopy tomorrow so will try bowel prep today. Surgery is following.   Aundrea Higginbotham, MD FACG See AMION, [facility], for our on call provider  

## 2022-01-03 NOTE — H&P (View-Only) (Signed)
     [facility] Progress Note  CC:  Abdominal pain.  Ileal inflammation.  SBO/PSBO  Subjective:  Feeling much better today.  Passing flatus and liquid stool.  No significant pain.  Objective:  Vital signs in last 24 hours: Temp:  [97.8 F (36.6 C)-98 F (36.7 C)] 98 F (36.7 C) (10/19 0403) Pulse Rate:  [68-87] 68 (10/19 0403) Resp:  [17-18] 17 (10/19 0403) BP: (104-111)/(65-73) 104/65 (10/19 0403) SpO2:  [97 %-99 %] 97 % (10/19 0403) Last BM Date : 01/01/22 General:  Alert, Well-developed, in NAD Heart:  Regular rate and rhythm; no murmurs Pulm:  CTAB.  No W/R/R. Abdomen:  Soft, non-distended.  BS present.  Minimal TTP.  Right inguinal hernia noted, but reducible.   Extremities:  Without edema. Neurologic:  Alert and oriented x 4;  grossly normal neurologically. Psych:  Alert and cooperative. Normal mood and affect.  Lab Results: Recent Labs    01/01/22 0421 01/02/22 0429 01/03/22 0253  WBC 8.9 9.2 9.3  HGB 12.4* 13.4 12.5*  HCT 36.6* 40.0 36.7*  PLT 281 285 283   BMET Recent Labs    01/01/22 0421 01/02/22 0429 01/03/22 0253  NA 138 138 135  K 3.6 3.7 4.2  CL 102 103 101  CO2 27 25 27   GLUCOSE 97 106* 131*  BUN 16 13 19   CREATININE 1.14 1.19 1.08  CALCIUM 8.5* 8.5* 8.6*   DG Abd 1 View  Result Date: 01/02/2022 CLINICAL DATA:  Bowel obstruction EXAM: ABDOMEN - 1 VIEW COMPARISON:  01/01/2022 FINDINGS: Most of the contrast previously seen within the colon has been evacuated with only a small amount remaining in the distal descending colon, sigmoid colon and rectum. There is a persistent small bowel obstruction pattern with dilated fluid and air-filled small intestine. Maximal diameter measured at 4.5 cm. IMPRESSION: Most of the contrast previously seen within the colon has been evacuated with only a small amount remaining in the distal descending colon, sigmoid colon and rectum. Persistent small bowel obstruction pattern. Electronically Signed    By: Nelson Chimes M.D.   On: 01/02/2022 11:36    Assessment / Plan: PSBO/SBO.  Ileal inflammation, ? Acute infections vs. Crohn's disease.  Also has a new right inguinal hernia.  Surgery has seen the patient; no plan for surgery at least during this hospital stay.  -Continue solumedrol 60 mg daily for now. -He wants to try to proceed with colonoscopy on 10/20.  Will use Miralax/Gatorade bowel prep.  Continue clear liquids for now and then NPO after midnight except for bowel prep.    LOS: 4 days   Laban Emperor. Zehr  01/03/2022, 11:01 AM     Attending Physician Note   I have taken an interval history, reviewed the chart and examined the patient. I performed a substantive portion of this encounter, including complete performance of at least one of the key components, in conjunction with the APP. I agree with the APP's note, impression and recommendations with my edits. My additional impressions and recommendations are as follows.   PSBO improved today. TI thickening - R/O Crohn's, infection, other. Right inguinal hernia.  Day 2 Solu-Medrol 60 mg IV. He wants proceed with colonoscopy tomorrow so will try bowel prep today. Surgery is following.   Lucio Edward, MD [facility] See AMION, [facility], for our on call provider

## 2022-01-03 NOTE — Progress Notes (Signed)
[facility] Progress Note  1 Day Post-Op  Subjective: CC-  Continues to have some intermittent RLQ abdominal pain, but is feeling well this morning. Denies any n/v. Tolerating clear liquids. Passing flatus and continues to have loose stool.  Objective: Vital signs in last 24 hours: Temp:  [97.8 F (36.6 C)-98 F (36.7 C)] 98 F (36.7 C) (10/19 0403) Pulse Rate:  [68-87] 68 (10/19 0403) Resp:  [17-18] 17 (10/19 0403) BP: (104-111)/(65-73) 104/65 (10/19 0403) SpO2:  [97 %-99 %] 97 % (10/19 0403) Last BM Date : 01/01/22  Intake/Output from previous day: No intake/output data recorded. Intake/Output this shift: No intake/output data recorded.  PE: Gen:  Alert, NAD, pleasant Abd: soft, nondistended, mild RLQ tenderness without rebound or guarding  Lab Results:  Recent Labs    01/02/22 0429 01/03/22 0253  WBC 9.2 9.3  HGB 13.4 12.5*  HCT 40.0 36.7*  PLT 285 283   BMET Recent Labs    01/02/22 0429 01/03/22 0253  NA 138 135  K 3.7 4.2  CL 103 101  CO2 25 27  GLUCOSE 106* 131*  BUN 13 19  CREATININE 1.19 1.08  CALCIUM 8.5* 8.6*   PT/INR No results for input(s): "LABPROT", "INR" in the last 72 hours. CMP     Component Value Date/Time   NA 135 01/03/2022 0253   K 4.2 01/03/2022 0253   CL 101 01/03/2022 0253   CO2 27 01/03/2022 0253   GLUCOSE 131 (H) 01/03/2022 0253   BUN 19 01/03/2022 0253   CREATININE 1.08 01/03/2022 0253   CREATININE 1.08 11/01/2014 0001   CALCIUM 8.6 (L) 01/03/2022 0253   PROT 7.3 12/30/2021 1704   ALBUMIN 4.1 12/30/2021 1704   AST 20 12/30/2021 1704   ALT 14 12/30/2021 1704   ALKPHOS 85 12/30/2021 1704   BILITOT 0.7 12/30/2021 1704   GFRNONAA >60 01/03/2022 0253   GFRAA >60 12/28/2015 0931   Lipase     Component Value Date/Time   LIPASE 28 12/30/2021 1704       Studies/Results: DG Abd 1 View  Result Date: 01/02/2022 CLINICAL DATA:  Bowel obstruction EXAM: ABDOMEN - 1 VIEW COMPARISON:  01/01/2022 FINDINGS:  Most of the contrast previously seen within the colon has been evacuated with only a small amount remaining in the distal descending colon, sigmoid colon and rectum. There is a persistent small bowel obstruction pattern with dilated fluid and air-filled small intestine. Maximal diameter measured at 4.5 cm. IMPRESSION: Most of the contrast previously seen within the colon has been evacuated with only a small amount remaining in the distal descending colon, sigmoid colon and rectum. Persistent small bowel obstruction pattern. Electronically Signed   By: Nelson Chimes M.D.   On: 01/02/2022 11:36    Anti-infectives: Anti-infectives (From admission, onward)    None        Assessment/Plan SBO, crohns vs adhesive - CT scan 10/15 shows SBO with transition point in the RLQ, worrisome for crohn's disease.  - GI following, started steroids 10/18 - Tolerating clear liquids and continues to have bowel function without n/v. Will reach out to GI about possibility of advancing to fulls if no plans for colonoscopy.   FEN - IVF, CLD VTE - lovenox ID - none currently needed   Right inguinal hernia - small, soft/reducible, no acute issues Anal fistula   I reviewed [facility] gastroenterology notes, last 24 h vitals and pain scores, last 48 h intake and output, last 24 h labs and trends, and last 24  h imaging results.    LOS: 4 days    Franne Forts, [facility] 01/03/2022, 10:33 AM Please see Amion for pager number during day hours 7:00am-4:30pm

## 2022-01-03 NOTE — Plan of Care (Signed)
  Problem: Activity: Goal: Risk for activity intolerance will decrease Outcome: Progressing   Problem: Coping: Goal: Level of anxiety will decrease Outcome: Progressing   Problem: Pain Managment: Goal: General experience of comfort will improve Outcome: Progressing   

## 2022-01-04 ENCOUNTER — Encounter (HOSPITAL_COMMUNITY): Payer: Self-pay

## 2022-01-04 ENCOUNTER — Encounter (HOSPITAL_COMMUNITY): Admission: EM | Disposition: A | Discharge status: Home / Self Care | Payer: Self-pay | Source: Home / Self Care

## 2022-01-04 ENCOUNTER — Inpatient Hospital Stay (HOSPITAL_COMMUNITY): Payer: BC Managed Care – PPO | Admitting: Certified Registered"

## 2022-01-04 DIAGNOSIS — R933 Abnormal findings on diagnostic imaging of other parts of digestive tract: Secondary | ICD-10-CM

## 2022-01-04 HISTORY — PX: COLONOSCOPY WITH PROPOFOL: SHX5780

## 2022-01-04 HISTORY — PX: BIOPSY: SHX5522

## 2022-01-04 LAB — BASIC METABOLIC PANEL
Anion gap: 7 (ref 5–15)
BUN: 17 mg/dL (ref 6–20)
CO2: 29 mmol/L (ref 22–32)
Calcium: 8.5 mg/dL — ABNORMAL LOW (ref 8.9–10.3)
Chloride: 98 mmol/L (ref 98–111)
Creatinine, Ser: 1.15 mg/dL (ref 0.61–1.24)
GFR, Estimated: >60 mL/min (ref >60)
Glucose, Bld: 128 mg/dL — ABNORMAL HIGH (ref 70–99)
Potassium: 4.1 mmol/L (ref 3.5–5.1)
Sodium: 134 mmol/L — ABNORMAL LOW (ref 135–145)

## 2022-01-04 LAB — CBC
HCT: 32 % — ABNORMAL LOW (ref 39.0–52.0)
Hemoglobin: 11.2 g/dL — ABNORMAL LOW (ref 13.0–17.0)
MCH: 32.1 pg (ref 26.0–34.0)
MCHC: 35 g/dL (ref 30.0–36.0)
MCV: 91.7 fL (ref 80.0–100.0)
Platelets: 250 10*3/uL (ref 150–400)
RBC: 3.49 MIL/uL — ABNORMAL LOW (ref 4.22–5.81)
RDW: 11.3 % — ABNORMAL LOW (ref 11.5–15.5)
WBC: 5.8 10*3/uL (ref 4.0–10.5)
nRBC: 0 % (ref 0.0–0.2)

## 2022-01-04 SURGERY — COLONOSCOPY WITH PROPOFOL
Anesthesia: Monitor Anesthesia Care

## 2022-01-04 MED ORDER — PROPOFOL 10 MG/ML IV BOLUS
INTRAVENOUS | Status: DC | PRN
  Administered 2022-01-04: 20 mg via INTRAVENOUS
  Administered 2022-01-04: 30 mg via INTRAVENOUS

## 2022-01-04 MED ORDER — PROPOFOL 500 MG/50ML IV EMUL
INTRAVENOUS | Status: DC | PRN
  Administered 2022-01-04: 150 ug/kg/min via INTRAVENOUS

## 2022-01-04 MED ORDER — SODIUM CHLORIDE 0.9 % IV SOLN: INTRAVENOUS | Status: DC

## 2022-01-04 MED ORDER — LACTATED RINGERS IV SOLN
INTRAVENOUS | Status: AC | PRN
  Administered 2022-01-04: 20 mL/h via INTRAVENOUS

## 2022-01-04 SURGICAL SUPPLY — 22 items

## 2022-01-04 NOTE — Transfer of Care (Signed)
Immediate Anesthesia Transfer of Care Note  Patient: George Clark  Procedure(s) Performed: COLONOSCOPY WITH PROPOFOL BIOPSY  Patient Location: PACU  Anesthesia Type:MAC  Level of Consciousness: drowsy  Airway & Oxygen Therapy: Patient Spontanous Breathing and Patient connected to face mask oxygen  Post-op Assessment: Report given to RN and Post -op Vital signs reviewed and stable  Post vital signs: Reviewed and stable  Last Vitals:  Vitals Value Taken Time  BP 103/66 01/04/22 1331  Temp    Pulse 67 01/04/22 1333  Resp 10 01/04/22 1333  SpO2 100 % 01/04/22 1333  Vitals shown include unvalidated device data.  Last Pain:  Vitals:   01/04/22 1206  TempSrc: Temporal  PainSc: 0-No pain         Complications: No notable events documented.

## 2022-01-04 NOTE — Progress Notes (Signed)
[facility] Progress Note  2 Days Post-Op  Subjective: CC-  Overall feeling better. He reports very mild RLQ pain. No n/v. Continues to pass flatus. Finished bowel prep around midnight and had not had any more bowel movements since then.  Objective: Vital signs in last 24 hours: Temp:  [97.8 F (36.6 C)-97.9 F (36.6 C)] 97.8 F (36.6 C) (10/20 0408) Pulse Rate:  [67-89] 67 (10/20 0408) Resp:  [15-18] 15 (10/20 0408) BP: (105-115)/(63-75) 105/63 (10/20 0408) SpO2:  [97 %-98 %] 97 % (10/20 0408) Last BM Date : 01/01/22  Intake/Output from previous day: No intake/output data recorded. Intake/Output this shift: No intake/output data recorded.  PE: Gen:  Alert, NAD, pleasant Abd: soft, mild distension, minimal RLQ TTP without rebound or guarding  Lab Results:  Recent Labs    01/03/22 0253 01/04/22 0318  WBC 9.3 5.8  HGB 12.5* 11.2*  HCT 36.7* 32.0*  PLT 283 250   BMET Recent Labs    01/03/22 0253 01/04/22 0318  NA 135 134*  K 4.2 4.1  CL 101 98  CO2 27 29  GLUCOSE 131* 128*  BUN 19 17  CREATININE 1.08 1.15  CALCIUM 8.6* 8.5*   PT/INR No results for input(s): "LABPROT", "INR" in the last 72 hours. CMP     Component Value Date/Time   NA 134 (L) 01/04/2022 0318   K 4.1 01/04/2022 0318   CL 98 01/04/2022 0318   CO2 29 01/04/2022 0318   GLUCOSE 128 (H) 01/04/2022 0318   BUN 17 01/04/2022 0318   CREATININE 1.15 01/04/2022 0318   CREATININE 1.08 11/01/2014 0001   CALCIUM 8.5 (L) 01/04/2022 0318   PROT 7.3 12/30/2021 1704   ALBUMIN 4.1 12/30/2021 1704   AST 20 12/30/2021 1704   ALT 14 12/30/2021 1704   ALKPHOS 85 12/30/2021 1704   BILITOT 0.7 12/30/2021 1704   GFRNONAA >60 01/04/2022 0318   GFRAA >60 12/28/2015 0931   Lipase     Component Value Date/Time   LIPASE 28 12/30/2021 1704       Studies/Results: DG Abd 1 View  Result Date: 01/02/2022 CLINICAL DATA:  Bowel obstruction EXAM: ABDOMEN - 1 VIEW COMPARISON:  01/01/2022  FINDINGS: Most of the contrast previously seen within the colon has been evacuated with only a small amount remaining in the distal descending colon, sigmoid colon and rectum. There is a persistent small bowel obstruction pattern with dilated fluid and air-filled small intestine. Maximal diameter measured at 4.5 cm. IMPRESSION: Most of the contrast previously seen within the colon has been evacuated with only a small amount remaining in the distal descending colon, sigmoid colon and rectum. Persistent small bowel obstruction pattern. Electronically Signed   By: Nelson Chimes M.D.   On: 01/02/2022 11:36    Anti-infectives: Anti-infectives (From admission, onward)    None        Assessment/Plan SBO, crohns vs adhesive - CT scan 10/15 shows SBO with transition point in the RLQ, worrisome for crohn's disease.  - GI following, started steroids 10/18 - Colonoscopy today.   FEN - IVF, NPO VTE - lovenox ID - none currently needed   Right inguinal hernia - small, soft/reducible, no acute issues Anal fistula   I reviewed Consultant gastroenterology notes, last 24 h vitals and pain scores, last 48 h intake and output, last 24 h labs and trends, and last 24 h imaging results.    LOS: 5 days    Wellington Hampshire, [facility] 01/04/2022, 7:14  AM Please see Amion for pager number during day hours 7:00am-4:30pm

## 2022-01-04 NOTE — Interval H&P Note (Signed)
History and Physical Interval Note:  01/04/2022 1:01 PM  George Clark  has presented today for surgery, with the diagnosis of Abnormal CT scan, PSBO, ? Crohn's.  The various methods of treatment have been discussed with the patient and family. After consideration of risks, benefits and other options for treatment, the patient has consented to  Procedure(s): COLONOSCOPY WITH PROPOFOL (N/A) as a surgical intervention.  The patient's history has been reviewed, patient examined, no change in status, stable for surgery.  I have reviewed the patient's chart and labs.  Questions were answered to the patient's satisfaction.     Pricilla Riffle. Fuller Plan

## 2022-01-04 NOTE — Anesthesia Postprocedure Evaluation (Signed)
Anesthesia Post Note  Patient: George Clark  Procedure(s) Performed: COLONOSCOPY WITH PROPOFOL BIOPSY     Patient location during evaluation: PACU Anesthesia Type: MAC Level of consciousness: awake and alert and oriented Pain management: pain level controlled Vital Signs Assessment: post-procedure vital signs reviewed and stable Respiratory status: spontaneous breathing, nonlabored ventilation and respiratory function stable Cardiovascular status: stable and blood pressure returned to baseline Postop Assessment: no apparent nausea or vomiting Anesthetic complications: no   No notable events documented.  Last Vitals:  Vitals:   01/04/22 1345 01/04/22 1400  BP: 120/72 117/70  Pulse: 76 72  Resp: 17 19  Temp:    SpO2: 99% 99%    Last Pain:  Vitals:   01/04/22 1400  TempSrc:   PainSc: 0-No pain                 Sidonia Nutter A.

## 2022-01-04 NOTE — Anesthesia Preprocedure Evaluation (Signed)
Anesthesia Evaluation  Patient identified by MRN, date of birth, ID band Patient awake    Reviewed: Allergy & Precautions, NPO status , Patient's Chart, lab work & pertinent test results  Airway Mallampati: II  TM Distance: >3 FB Neck ROM: Full    Dental no notable dental hx. (+) Caps, Dental Advisory Given, Teeth Intact   Pulmonary neg pulmonary ROS,    Pulmonary exam normal breath sounds clear to auscultation       Cardiovascular Normal cardiovascular exam Rhythm:Regular Rate:Normal     Neuro/Psych negative neurological ROS  negative psych ROS   GI/Hepatic Neg liver ROS, Small bowel obstruction   Endo/Other  negative endocrine ROS  Renal/GU negative Renal ROS  negative genitourinary   Musculoskeletal negative musculoskeletal ROS (+)   Abdominal   Peds  Hematology  (+) Blood dyscrasia, anemia ,   Anesthesia Other Findings   Reproductive/Obstetrics                             Anesthesia Physical Anesthesia Plan  ASA: 2  Anesthesia Plan: MAC   Post-op Pain Management: Minimal or no pain anticipated   Induction:   PONV Risk Score and Plan: 1 and Treatment may vary due to age or medical condition and Propofol infusion  Airway Management Planned: Natural Airway, Nasal Cannula and Simple Face Mask  Additional Equipment: None  Intra-op Plan:   Post-operative Plan:   Informed Consent: I have reviewed the patients History and Physical, chart, labs and discussed the procedure including the risks, benefits and alternatives for the proposed anesthesia with the patient or authorized representative who has indicated his/her understanding and acceptance.     Dental advisory given  Plan Discussed with: CRNA and Anesthesiologist  Anesthesia Plan Comments:         Anesthesia Quick Evaluation

## 2022-01-04 NOTE — Op Note (Signed)
[facility] Patient Name: George Clark Procedure Date : 01/04/2022 MRN: [id number] Attending MD: Ladene Artist , MD Date of Birth: July 02, 1970 CSN: [id number] Age: 51 Admit Type: Inpatient Procedure:                Colonoscopy Indications:              Abnormal CT of the GI tract (abnormal TI                            thickening), resolving pSBO Providers:                Pricilla Riffle. Fuller Plan, MD Referring MD:             Coralie Keens, MD Medicines:                Monitored Anesthesia Care Complications:            No immediate complications. Estimated blood loss:                            None. Estimated Blood Loss:     Estimated blood loss: none. Procedure:                Pre-Anesthesia Assessment:                           - Prior to the procedure, a History and Physical                            was performed, and patient medications and                            allergies were reviewed. The patient's tolerance of                            previous anesthesia was also reviewed. The risks                            and benefits of the procedure and the sedation                            options and risks were discussed with the patient.                            All questions were answered, and informed consent                            was obtained. Prior Anticoagulants: The patient has                            taken no previous anticoagulant or antiplatelet                            agents. ASA Grade Assessment: II - A patient with  mild systemic disease. After reviewing the risks                            and benefits, the patient was deemed in                            satisfactory condition to undergo the procedure.                           After obtaining informed consent, the colonoscope                            was passed under direct vision. Throughout the                            procedure, the patient's blood  pressure, pulse, and                            oxygen saturations were monitored continuously. The                            [id number] Olympus coloscope was                            introduced through the anus and advanced to the the                            terminal ileum, with identification of the                            appendiceal orifice and IC valve. The terminal                            ileum, ileocecal valve, appendiceal orifice, and                            rectum were photographed. The quality of the bowel                            preparation was fair. The colonoscopy was performed                            without difficulty. The patient tolerated the                            procedure well. Technical problems with equipment                            and no photos from the procedure were captured. Scope In: Scope Out: Findings:      The perianal and digital rectal examinations were normal.      Internal hemorrhoids were found during retroflexion. The hemorrhoids       were small and Grade I (internal hemorrhoids that do not prolapse).      Fair  bowel prep.      The terminal ileum appeared normal, examined ~ 15 cm of the TI. Biopsies       were taken with a cold forceps for histology.      The exam was otherwise without abnormality on direct and retroflexion       views Impression:               - Preparation of the colon was fair.                           - Internal hemorrhoids.                           - The examined portion (~15 cm) of the terminal                            ileum was normal. Biopsied.                           - The examination was otherwise normal on direct                            and retroflexion views. Recommendation:           - Repeat colonoscopy with a better bowel prep date                            to be determined after review of outside                            colonoscopy records.                            - Return patient to hospital ward for ongoing care.                           - No evidence of Crohn's or any other disease                            process in the colon or TI - discontinuing                            corticosteroids.                           - Patient has a contact number available for                            emergencies. The signs and symptoms of potential                            delayed complications were discussed with the                            patient. Return to normal activities tomorrow.                           -  Full liquid diet today and advance as tolerated.                           - Continue present medications.                           - Await pathology results. Procedure Code(s):        --- Professional ---                           [phone], Colonoscopy, flexible; with biopsy, single                            or multiple Diagnosis Code(s):        --- Professional ---                           K64.0, First degree hemorrhoids                           R93.3, Abnormal findings on diagnostic imaging of                            other parts of digestive tract CPT copyright 2019 [employer]. All rights reserved. The codes documented in this report are preliminary and upon coder review may  be revised to meet current compliance requirements. Ladene Artist, MD 01/04/2022 3:08:04 PM This report has been signed electronically. Number of Addenda: 0

## 2022-01-05 NOTE — Discharge Instructions (Signed)
Low fiber diet for 1-2 weeks then may resume normal diet

## 2022-01-05 NOTE — Discharge Summary (Signed)
    Patient ID: George Clark [id number] [birth year] 51 y.o.  Admit date: 12/30/2021 Discharge date: 01/05/2022  Admitting Diagnosis: SBO with TI thickening  Discharge Diagnosis Patient Active Problem List   Diagnosis Date Noted   Abnormal CT scan, small bowel    Small bowel obstruction ([place]) 12/30/2021   Abdominal pain 11/01/2014   DIARRHEA 08/23/2009    Consultants Dr. Fuller Plan, GI  Reason for Admission: George Clark is an 51 y.o. male who is here for abdominal pain, nausea and vomiting.  His symptoms started in the night last night.  Nausea, vomiting mid abdominal pain.  The pain worsened throughout the day.  He had a bowel movement on Saturday, no gas or stool today.  Vomited this morning, but nothing since then.  Now he seems to just have heartburn/acid into his throat feeling, no nausea or vomiting.  Pain improved some since pain medication but now returning.       He has a history of perforated appendicitis.  He has a history of anal fistula surgery.  He has a finicky GI track according to his wife though he denies and minimizes his symptoms.  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  He had a colonoscopy years ago and just found a polyp, no other abnormalities - maybe 7-10 years ago.  His son also has a finicky GI track but nobody in the family has IBD or a history of colon cancer.  Procedures Colonoscopy, 10/20, Dr. Dannette Barbara Course:  The patient was admitted and refused NGT placement for decompression.  GI was consulted given appearance of TI on CT scan for possible colonoscopy to evaluate for crohn's disease.  His symptoms did not immediately improve and he was started on steroids.  Ultimately, he improved enough to tolerate a bowel prep.  His colonoscopy revealed no evidence of crohn's disease or any other abnormality.  His diet was able to be ADAT.  He was stable for DC home on HD 6 with no further issues.  Physical  Exam: Gen: NAD Abd: soft, NT, ND, +BS  Allergies as of 01/05/2022   No Known Allergies      Medication List     TAKE these medications    HYDROcodone-acetaminophen 5-325 MG tablet Commonly known as: NORCO/VICODIN Take 1/2 tablet by mouth twice a day as needed for moderate pain   ibuprofen 200 MG tablet Commonly known as: ADVIL Take 400 mg by mouth daily as needed for fever (as needed).          Follow-up Information     Coralie Keens, MD Follow up today.   Specialty: General Surgery Why: As needed to discuss elective inguinal hernia for repair Contact information: [place] [address] [phone]         Ladene Artist, MD Follow up in 2 week(s).   Specialty: Gastroenterology Why: follow up for your colonoscopy Contact information: [address] [address] [phone]                 Signed: Saverio Danker, [facility] 01/05/2022, 8:29 AM Please see Amion for pager number during day hours 7:00am-4:30pm, 7-11:30am on Weekends

## 2022-01-05 NOTE — Progress Notes (Signed)
    Progress Note   Assessment    Partial SBO, resolved. Possible adhesive disease. No evidence for IBD, infection. Right inguinal hernia Mild normocytic anemia   Recommendations   OK for discharge from GI standpoint Ileal biopsies pending Follow up with PCP for further evaluation of anemia GI follow up as needed   Chief Complaint   Tolerated regular diet, no abdominal pain, diarrhea last night likely delayed bowel prep. Colonoscopy to TI yesterday was normal, except for small hemorrhoids.   Vital signs in last 24 hours: Temp:  [97.6 F (36.4 C)-98.5 F (36.9 C)] 98.5 F (36.9 C) (10/21 0737) Pulse Rate:  [66-76] 66 (10/21 0737) Resp:  [15-19] 18 (10/21 0737) BP: (102-124)/(59-88) 102/59 (10/21 0737) SpO2:  [98 %-100 %] 98 % (10/21 0737) Last BM Date : 01/01/22  General: Alert, well-developed, in NAD Heart:  Regular rate and rhythm; no murmurs Chest: Clear to ascultation bilaterally Abdomen:  Soft, mild lower abdominal tenderness and nondistended. Normal bowel sounds, without guarding, and without rebound.   Extremities:  Without edema. Neurologic:  Alert and  oriented x4; grossly normal neurologically. Psych:  Alert and cooperative. Normal mood and affect.  Intake/Output from previous day: 10/20 0701 - 10/21 0700 In: 150 [I.V.:150] Out: -  Intake/Output this shift: No intake/output data recorded.  Lab Results: Recent Labs    01/03/22 0253 01/04/22 0318  WBC 9.3 5.8  HGB 12.5* 11.2*  HCT 36.7* 32.0*  PLT 283 250   BMET Recent Labs    01/03/22 0253 01/04/22 0318  NA 135 134*  K 4.2 4.1  CL 101 98  CO2 27 29  GLUCOSE 131* 128*  BUN 19 17  CREATININE 1.08 1.15  CALCIUM 8.6* 8.5*     LOS: 6 days   Zoriyah Scheidegger T. Fuller Plan, MD 01/05/2022, 8:17 AM See Shea Evans, [facility], to contact our on call provider

## 2022-01-07 ENCOUNTER — Encounter (HOSPITAL_COMMUNITY): Payer: Self-pay | Admitting: Gastroenterology

## 2022-01-07 LAB — SURGICAL PATHOLOGY

## 2022-01-08 ENCOUNTER — Encounter: Payer: Self-pay | Admitting: Gastroenterology
